# Patient Record
Sex: Female | Born: 1980 | Race: Black or African American | Hispanic: No | Smoking: Former smoker
Health system: Southern US, Community
[De-identification: ages and names within clinical notes are randomized; demographics above are authoritative.]

## PROBLEM LIST (undated history)

## (undated) DIAGNOSIS — F32A Depression, unspecified: Secondary | ICD-10-CM

## (undated) DIAGNOSIS — Z720 Tobacco use: Secondary | ICD-10-CM

## (undated) DIAGNOSIS — F329 Major depressive disorder, single episode, unspecified: Secondary | ICD-10-CM

## (undated) DIAGNOSIS — U071 COVID-19: Secondary | ICD-10-CM

## (undated) DIAGNOSIS — K219 Gastro-esophageal reflux disease without esophagitis: Secondary | ICD-10-CM

## (undated) DIAGNOSIS — G43909 Migraine, unspecified, not intractable, without status migrainosus: Secondary | ICD-10-CM

## (undated) DIAGNOSIS — I10 Essential (primary) hypertension: Secondary | ICD-10-CM

## (undated) HISTORY — PX: KNEE SURGERY: SHX244

## (undated) HISTORY — DX: Tobacco use: Z72.0

---

## 2001-09-23 ENCOUNTER — Emergency Department (HOSPITAL_COMMUNITY): Admission: EM | Admit: 2001-09-23 | Discharge: 2001-09-23 | Payer: Self-pay | Admitting: *Deleted

## 2002-01-31 ENCOUNTER — Emergency Department (HOSPITAL_COMMUNITY): Admission: EM | Admit: 2002-01-31 | Discharge: 2002-02-01 | Payer: Self-pay | Admitting: Emergency Medicine

## 2002-12-05 ENCOUNTER — Emergency Department (HOSPITAL_COMMUNITY): Admission: EM | Admit: 2002-12-05 | Discharge: 2002-12-05 | Payer: Self-pay | Admitting: *Deleted

## 2003-09-08 ENCOUNTER — Emergency Department (HOSPITAL_COMMUNITY): Admission: EM | Admit: 2003-09-08 | Discharge: 2003-09-09 | Payer: Self-pay | Admitting: *Deleted

## 2004-03-04 ENCOUNTER — Emergency Department (HOSPITAL_COMMUNITY): Admission: EM | Admit: 2004-03-04 | Discharge: 2004-03-04 | Payer: Self-pay | Admitting: Emergency Medicine

## 2005-10-08 ENCOUNTER — Emergency Department (HOSPITAL_COMMUNITY): Admission: EM | Admit: 2005-10-08 | Discharge: 2005-10-09 | Payer: Self-pay | Admitting: Emergency Medicine

## 2006-03-10 ENCOUNTER — Emergency Department (HOSPITAL_COMMUNITY): Admission: EM | Admit: 2006-03-10 | Discharge: 2006-03-10 | Payer: Self-pay | Admitting: Emergency Medicine

## 2006-03-12 ENCOUNTER — Emergency Department (HOSPITAL_COMMUNITY): Admission: EM | Admit: 2006-03-12 | Discharge: 2006-03-12 | Payer: Self-pay | Admitting: Emergency Medicine

## 2006-07-24 ENCOUNTER — Emergency Department (HOSPITAL_COMMUNITY): Admission: EM | Admit: 2006-07-24 | Discharge: 2006-07-24 | Payer: Self-pay | Admitting: Emergency Medicine

## 2006-10-25 ENCOUNTER — Emergency Department (HOSPITAL_COMMUNITY): Admission: EM | Admit: 2006-10-25 | Discharge: 2006-10-25 | Payer: Self-pay | Admitting: Emergency Medicine

## 2007-01-30 ENCOUNTER — Emergency Department (HOSPITAL_COMMUNITY): Admission: EM | Admit: 2007-01-30 | Discharge: 2007-01-30 | Payer: Self-pay | Admitting: Emergency Medicine

## 2007-11-23 ENCOUNTER — Emergency Department (HOSPITAL_COMMUNITY): Admission: EM | Admit: 2007-11-23 | Discharge: 2007-11-23 | Payer: Self-pay | Admitting: Emergency Medicine

## 2009-04-11 ENCOUNTER — Emergency Department (HOSPITAL_COMMUNITY): Admission: EM | Admit: 2009-04-11 | Discharge: 2009-04-12 | Payer: Self-pay | Admitting: Emergency Medicine

## 2010-01-31 ENCOUNTER — Emergency Department (HOSPITAL_COMMUNITY): Admission: EM | Admit: 2010-01-31 | Discharge: 2010-01-31 | Payer: Self-pay | Admitting: Emergency Medicine

## 2010-05-01 ENCOUNTER — Emergency Department (HOSPITAL_COMMUNITY): Admission: EM | Admit: 2010-05-01 | Discharge: 2010-05-01 | Payer: Self-pay | Admitting: Emergency Medicine

## 2010-05-07 ENCOUNTER — Emergency Department (HOSPITAL_COMMUNITY): Admission: EM | Admit: 2010-05-07 | Discharge: 2010-05-08 | Payer: Self-pay | Admitting: Emergency Medicine

## 2010-05-19 ENCOUNTER — Emergency Department (HOSPITAL_COMMUNITY): Admission: EM | Admit: 2010-05-19 | Discharge: 2010-05-20 | Payer: Self-pay | Admitting: Emergency Medicine

## 2010-11-10 LAB — CBC
HCT: 39.2 % (ref 36.0–46.0)
Hemoglobin: 13.6 g/dL (ref 12.0–15.0)
Hemoglobin: 12.6 g/dL (ref 12.0–15.0)
MCH: 32.8 pg (ref 26.0–34.0)
MCHC: 34.7 g/dL (ref 30.0–36.0)
MCHC: 33.3 g/dL (ref 30.0–36.0)
Platelets: 285 10*3/uL (ref 150–400)
RBC: 3.94 MIL/uL (ref 3.87–5.11)

## 2010-11-10 LAB — DIFFERENTIAL
Basophils Relative: 0 % (ref 0–1)
Basophils Relative: 1 % (ref 0–1)
Eosinophils Absolute: 0 10*3/uL (ref 0.0–0.7)
Eosinophils Relative: 0 % (ref 0–5)
Lymphs Abs: 2.3 10*3/uL (ref 0.7–4.0)
Monocytes Absolute: 0.4 10*3/uL (ref 0.1–1.0)
Monocytes Relative: 6 % (ref 3–12)
Monocytes Relative: 9 % (ref 3–12)
Neutro Abs: 5.2 10*3/uL (ref 1.7–7.7)
Neutrophils Relative %: 80 % — ABNORMAL HIGH (ref 43–77)
Neutrophils Relative %: 62 % (ref 43–77)

## 2010-11-10 LAB — RH IMMUNE GLOBULIN WORKUP (NOT WOMEN'S HOSP)
ABO/RH(D): O NEG
Antibody Screen: NEGATIVE

## 2010-11-10 LAB — URINALYSIS, ROUTINE W REFLEX MICROSCOPIC
Bilirubin Urine: NEGATIVE
Bilirubin Urine: NEGATIVE
Glucose, UA: NEGATIVE mg/dL
Glucose, UA: NEGATIVE mg/dL
Glucose, UA: NEGATIVE mg/dL
Hgb urine dipstick: NEGATIVE
Hgb urine dipstick: NEGATIVE
Ketones, ur: 15 mg/dL — AB
Ketones, ur: NEGATIVE mg/dL
Nitrite: NEGATIVE
Protein, ur: 30 mg/dL — AB
Protein, ur: NEGATIVE mg/dL
Specific Gravity, Urine: 1.02 (ref 1.005–1.030)
Specific Gravity, Urine: <1.005 — ABNORMAL LOW (ref 1.005–1.030)
Urobilinogen, UA: 1 mg/dL (ref 0.0–1.0)
Urobilinogen, UA: 0.2 mg/dL (ref 0.0–1.0)
pH: 6.5 (ref 5.0–8.0)
pH: 6.5 (ref 5.0–8.0)
pH: 6.5 (ref 5.0–8.0)

## 2010-11-10 LAB — ABO/RH: ABO/RH(D): O NEG

## 2010-11-10 LAB — BASIC METABOLIC PANEL
CO2: 24 mEq/L (ref 19–32)
Calcium: 9.4 mg/dL (ref 8.4–10.5)
Calcium: 9.3 mg/dL (ref 8.4–10.5)
Creatinine, Ser: 0.76 mg/dL (ref 0.4–1.2)
GFR calc Af Amer: >60 mL/min (ref >60)
GFR calc non Af Amer: >60 mL/min (ref >60)
Glucose, Bld: 114 mg/dL — ABNORMAL HIGH (ref 70–99)
Sodium: 137 mEq/L (ref 135–145)
Sodium: 133 mEq/L — ABNORMAL LOW (ref 135–145)

## 2010-11-10 LAB — HCG, QUANTITATIVE, PREGNANCY: hCG, Beta Chain, Quant, S: 15335 m[IU]/mL — ABNORMAL HIGH (ref <5)

## 2010-11-10 LAB — COMPREHENSIVE METABOLIC PANEL
AST: 16 U/L (ref 0–37)
Albumin: 4.2 g/dL (ref 3.5–5.2)
Alkaline Phosphatase: 35 U/L — ABNORMAL LOW (ref 39–117)
Chloride: 102 mEq/L (ref 96–112)
GFR calc Af Amer: >60 mL/min (ref >60)
Potassium: 3.2 mEq/L — ABNORMAL LOW (ref 3.5–5.1)
Total Bilirubin: 0.8 mg/dL (ref 0.3–1.2)
Total Protein: 7.3 g/dL (ref 6.0–8.3)

## 2010-11-10 LAB — URINE MICROSCOPIC-ADD ON

## 2010-11-10 LAB — RPR: RPR Ser Ql: NONREACTIVE

## 2010-11-10 LAB — GC/CHLAMYDIA PROBE AMP, GENITAL
Chlamydia, DNA Probe: NEGATIVE
GC Probe Amp, Genital: NEGATIVE

## 2010-11-14 LAB — CBC
HCT: 36.4 % (ref 36.0–46.0)
Hemoglobin: 12.3 g/dL (ref 12.0–15.0)
MCV: 94.1 fL (ref 78.0–100.0)
Platelets: 268 10*3/uL (ref 150–400)
WBC: 6.3 10*3/uL (ref 4.0–10.5)

## 2010-11-14 LAB — COMPREHENSIVE METABOLIC PANEL
Albumin: 3.9 g/dL (ref 3.5–5.2)
Alkaline Phosphatase: 49 U/L (ref 39–117)
BUN: 9 mg/dL (ref 6–23)
Chloride: 108 mEq/L (ref 96–112)
Creatinine, Ser: 0.77 mg/dL (ref 0.4–1.2)
GFR calc non Af Amer: >60 mL/min (ref >60)
Glucose, Bld: 86 mg/dL (ref 70–99)
Potassium: 3.3 mEq/L — ABNORMAL LOW (ref 3.5–5.1)
Total Bilirubin: 0.6 mg/dL (ref 0.3–1.2)

## 2010-11-14 LAB — POCT PREGNANCY, URINE: Preg Test, Ur: NEGATIVE

## 2010-11-14 LAB — DIFFERENTIAL
Basophils Absolute: 0.1 10*3/uL (ref 0.0–0.1)
Basophils Relative: 2 % — ABNORMAL HIGH (ref 0–1)
Lymphocytes Relative: 31 % (ref 12–46)
Monocytes Absolute: 0.6 10*3/uL (ref 0.1–1.0)
Neutro Abs: 3.5 10*3/uL (ref 1.7–7.7)
Neutrophils Relative %: 57 % (ref 43–77)

## 2010-11-14 LAB — URINALYSIS, ROUTINE W REFLEX MICROSCOPIC
Glucose, UA: NEGATIVE mg/dL
Hgb urine dipstick: NEGATIVE
Ketones, ur: NEGATIVE mg/dL
Protein, ur: NEGATIVE mg/dL
pH: 7.5 (ref 5.0–8.0)

## 2010-11-14 LAB — LIPASE, BLOOD: Lipase: 18 U/L (ref 11–59)

## 2010-12-09 ENCOUNTER — Emergency Department (HOSPITAL_COMMUNITY)
Admission: EM | Admit: 2010-12-09 | Discharge: 2010-12-09 | Disposition: A | Discharge status: Home / Self Care | Payer: Medicaid Other | Attending: Emergency Medicine | Admitting: Emergency Medicine

## 2010-12-09 DIAGNOSIS — R5381 Other malaise: Secondary | ICD-10-CM | POA: Insufficient documentation

## 2010-12-09 DIAGNOSIS — R05 Cough: Secondary | ICD-10-CM | POA: Insufficient documentation

## 2010-12-09 DIAGNOSIS — K219 Gastro-esophageal reflux disease without esophagitis: Secondary | ICD-10-CM | POA: Insufficient documentation

## 2010-12-09 DIAGNOSIS — J3489 Other specified disorders of nose and nasal sinuses: Secondary | ICD-10-CM | POA: Insufficient documentation

## 2010-12-09 DIAGNOSIS — R509 Fever, unspecified: Secondary | ICD-10-CM | POA: Insufficient documentation

## 2010-12-09 DIAGNOSIS — R059 Cough, unspecified: Secondary | ICD-10-CM | POA: Insufficient documentation

## 2010-12-09 DIAGNOSIS — M549 Dorsalgia, unspecified: Secondary | ICD-10-CM | POA: Insufficient documentation

## 2010-12-09 DIAGNOSIS — R112 Nausea with vomiting, unspecified: Secondary | ICD-10-CM | POA: Insufficient documentation

## 2010-12-09 DIAGNOSIS — IMO0001 Reserved for inherently not codable concepts without codable children: Secondary | ICD-10-CM | POA: Insufficient documentation

## 2010-12-09 DIAGNOSIS — A5901 Trichomonal vulvovaginitis: Secondary | ICD-10-CM | POA: Insufficient documentation

## 2010-12-09 DIAGNOSIS — N72 Inflammatory disease of cervix uteri: Secondary | ICD-10-CM | POA: Insufficient documentation

## 2010-12-09 LAB — COMPREHENSIVE METABOLIC PANEL
ALT: 11 U/L (ref 0–35)
AST: 16 U/L (ref 0–37)
CO2: 27 mEq/L (ref 19–32)
Chloride: 102 mEq/L (ref 96–112)
GFR calc Af Amer: >60 mL/min (ref >60)
GFR calc non Af Amer: >60 mL/min (ref >60)
Glucose, Bld: 96 mg/dL (ref 70–99)
Sodium: 136 mEq/L (ref 135–145)
Total Bilirubin: 0.6 mg/dL (ref 0.3–1.2)

## 2010-12-09 LAB — DIFFERENTIAL
Basophils Absolute: 0 10*3/uL (ref 0.0–0.1)
Basophils Relative: 0 % (ref 0–1)
Lymphocytes Relative: 10 % — ABNORMAL LOW (ref 12–46)
Monocytes Absolute: 1.1 10*3/uL — ABNORMAL HIGH (ref 0.1–1.0)
Monocytes Relative: 8 % (ref 3–12)
Neutro Abs: 12.5 10*3/uL — ABNORMAL HIGH (ref 1.7–7.7)
Neutrophils Relative %: 82 % — ABNORMAL HIGH (ref 43–77)

## 2010-12-09 LAB — CBC
HCT: 40.9 % (ref 36.0–46.0)
Hemoglobin: 13.9 g/dL (ref 12.0–15.0)
MCH: 32.3 pg (ref 26.0–34.0)
RBC: 4.31 MIL/uL (ref 3.87–5.11)

## 2010-12-09 LAB — URINALYSIS, ROUTINE W REFLEX MICROSCOPIC
Ketones, ur: 15 mg/dL — AB
Nitrite: NEGATIVE
Protein, ur: 30 mg/dL — AB
Urobilinogen, UA: 1 mg/dL (ref 0.0–1.0)
pH: 6.5 (ref 5.0–8.0)

## 2010-12-09 LAB — URINE MICROSCOPIC-ADD ON

## 2010-12-09 LAB — POCT PREGNANCY, URINE: Preg Test, Ur: NEGATIVE

## 2010-12-09 LAB — LIPASE, BLOOD: Lipase: 15 U/L (ref 11–59)

## 2010-12-10 LAB — URINE CULTURE
Colony Count: >=100000
Culture  Setup Time: 201204132138

## 2010-12-10 LAB — GC/CHLAMYDIA PROBE AMP, GENITAL: Chlamydia, DNA Probe: NEGATIVE

## 2011-06-15 LAB — CBC
Hemoglobin: 12
MCHC: 35.3
Platelets: 269
RDW: 13.5

## 2011-06-15 LAB — BASIC METABOLIC PANEL
BUN: 15
CO2: 25
Calcium: 8.9
Creatinine, Ser: 1.05
Glucose, Bld: 100 — ABNORMAL HIGH

## 2011-06-15 LAB — URINALYSIS, ROUTINE W REFLEX MICROSCOPIC
Bilirubin Urine: NEGATIVE
Protein, ur: NEGATIVE
Urobilinogen, UA: 1

## 2011-06-15 LAB — DIFFERENTIAL
Basophils Absolute: 0.1
Basophils Relative: 1
Monocytes Absolute: 0.4
Neutro Abs: 4.2
Neutrophils Relative %: 59

## 2011-06-15 LAB — PREGNANCY, URINE: Preg Test, Ur: NEGATIVE

## 2011-06-15 LAB — URINE MICROSCOPIC-ADD ON

## 2011-06-30 ENCOUNTER — Other Ambulatory Visit (HOSPITAL_COMMUNITY): Payer: Self-pay | Admitting: Internal Medicine

## 2011-07-03 ENCOUNTER — Other Ambulatory Visit (HOSPITAL_COMMUNITY): Payer: Medicaid Other

## 2011-07-04 ENCOUNTER — Ambulatory Visit (HOSPITAL_COMMUNITY)
Admission: RE | Admit: 2011-07-04 | Discharge: 2011-07-04 | Disposition: A | Discharge status: Home / Self Care | Payer: Medicaid Other | Source: Ambulatory Visit | Attending: Internal Medicine | Admitting: Internal Medicine

## 2011-07-04 DIAGNOSIS — R109 Unspecified abdominal pain: Secondary | ICD-10-CM | POA: Insufficient documentation

## 2011-07-04 DIAGNOSIS — R112 Nausea with vomiting, unspecified: Secondary | ICD-10-CM | POA: Insufficient documentation

## 2011-07-13 ENCOUNTER — Encounter (HOSPITAL_COMMUNITY): Payer: Self-pay | Admitting: Pharmacy Technician

## 2011-07-13 ENCOUNTER — Encounter (HOSPITAL_COMMUNITY)
Admission: RE | Admit: 2011-07-13 | Discharge: 2011-07-13 | Disposition: A | Discharge status: Home / Self Care | Payer: Medicaid Other | Source: Ambulatory Visit | Attending: General Surgery | Admitting: General Surgery

## 2011-07-13 ENCOUNTER — Other Ambulatory Visit: Payer: Self-pay

## 2011-07-13 ENCOUNTER — Encounter (HOSPITAL_COMMUNITY): Payer: Self-pay

## 2011-07-13 HISTORY — DX: Depression, unspecified: F32.A

## 2011-07-13 HISTORY — DX: Major depressive disorder, single episode, unspecified: F32.9

## 2011-07-13 LAB — BASIC METABOLIC PANEL
CO2: 27 mEq/L (ref 19–32)
Calcium: 9.8 mg/dL (ref 8.4–10.5)
Creatinine, Ser: 0.87 mg/dL (ref 0.50–1.10)
GFR calc non Af Amer: 89 mL/min — ABNORMAL LOW (ref >90)
Glucose, Bld: 93 mg/dL (ref 70–99)
Sodium: 139 mEq/L (ref 135–145)

## 2011-07-13 LAB — DIFFERENTIAL
Basophils Absolute: 0.1 10*3/uL (ref 0.0–0.1)
Basophils Relative: 1 % (ref 0–1)
Eosinophils Absolute: 0.1 10*3/uL (ref 0.0–0.7)
Eosinophils Relative: 1 % (ref 0–5)
Lymphs Abs: 1.9 10*3/uL (ref 0.7–4.0)
Neutrophils Relative %: 53 % (ref 43–77)

## 2011-07-13 LAB — CBC
MCH: 32 pg (ref 26.0–34.0)
MCHC: 34.3 g/dL (ref 30.0–36.0)
MCV: 93.3 fL (ref 78.0–100.0)
Platelets: 285 10*3/uL (ref 150–400)
RBC: 3.72 MIL/uL — ABNORMAL LOW (ref 3.87–5.11)
RDW: 13 % (ref 11.5–15.5)

## 2011-07-13 LAB — HEPATIC FUNCTION PANEL
ALT: 9 U/L (ref 0–35)
Albumin: 4 g/dL (ref 3.5–5.2)
Alkaline Phosphatase: 44 U/L (ref 39–117)
Total Bilirubin: 0.6 mg/dL (ref 0.3–1.2)
Total Protein: 7.4 g/dL (ref 6.0–8.3)

## 2011-07-13 LAB — SURGICAL PCR SCREEN: MRSA, PCR: NEGATIVE

## 2011-07-13 NOTE — H&P (Signed)
  NTS SOAP Note  Vital Signs:  Vitals as of: 07/13/2011: Systolic 178: Diastolic 112: Heart Rate 67: Temp 45F: Height 90ft 2in: Weight 144Lbs 0 Ounces: Pain Level 5: BMI 26  BMI : 26.34 kg/m2  Subjective: This 30 Years 9 Months old Female presents for of ABDOMINAL PAIN : ,Has been having intermittent right upper quadrant abdominal pain, nausea, and vomiting over the past few months. Fatty foods make it worse. No fever, chills, jaundice. U/S of gallbladder shows cholelithiasis with normal common bile duct.  Review of Symptoms:  Constitutional: unremarkable  Head: unremarkable  Eyes:unremarkable  Nose/Mouth/Throat:unremarkable  Cardiovascular:unremarkable  Respiratory: unremarkable  Gastrointestin abdominal pain,nausea,vomiting Genitourinary: unremarkable  Musculoskeletal: unremarkable  Skin: unremarkable  Hematolgic/Lymphatic: unremarkable  Allergic/Immunologic:unremarkable    Past Medical History:Reviewed  Past Medical History  Surgical History: none Medical Problems: none Allergies: nkda Medications: none   Social History: Reviewed   Social History  Preferred Language: English ([place]) Ethnicity: Not Hispanic / Latino Age: 30 Years 4 Months Alcohol: Yes, socially Recreational drug(s): No   Smoking Status: Current some day smoker reviewed on 07/13/2011 Started Date: 08/28/1998 Packs per week: 2.00   Family History: Reviewed  Family History  Is there a family history [id number]    Objective Information:  General: Well appearing, well nourished in no distress.  Head: Atraumatic; no masses; no abnormalities  No scleral icterus Heart: RRR, no murmur or gallop. Normal S1, S2. No S3, S4.  Lungs:CTA bilaterally, no wheezes, rhonchi, rales. Breathing unlabored.  Abdomen: Soft, tender in right upper quadrant to palpation, ND, normal bowel sounds, no HSM, no masses. No peritoneal signs.   Assessment: Biliary colic, cholelithiasis  Diagnosis &  Procedure: DiagnosisCode: 574.20, ProcedureCode: 19147,    Plan:Scheduled for laparoscopic cholecystectomy on 07/17/11.   Patient Education: Alternative treatments to surgery were discussed with patient (and family).Risks and benefits of procedure were fully explained to the patient (and family) who gave informed consent. Patient/family questions were addressed.  Follow-up: Pending Surgery

## 2011-07-13 NOTE — Pre-Procedure Instructions (Signed)
Bp 195/110,called to Dr Lovell Sheehan.He called Rx in to [facility]. Pt instructed to take as directed.She verbalized understanding of this.

## 2011-07-13 NOTE — Patient Instructions (Addendum)
21 Carol Edwards  07/13/2011   Your procedure is scheduled on:   07/17/2011 Report to [facility] at  1100  AM.  Call this number if you have problems the morning of surgery: [phone]   Remember:   Do not eat food:After Midnight.  Do not drink clear liquids: After Midnight.  Take these medicines the morning of surgery with A SIP OF WATER: reflux pill,blood pressure pill   Do not wear jewelry, make-up or nail polish.  Do not wear lotions, powders, or perfumes. You may wear deodorant.  Do not shave 48 hours prior to surgery.  Do not bring valuables to the hospital.  Contacts, dentures or bridgework may not be worn into surgery.  Leave suitcase in the car. After surgery it may be brought to your room.  For patients admitted to the hospital, checkout time is 11:00 AM the day of discharge.   Patients discharged the day of surgery will not be allowed to drive home.  Name and phone number of your driver: family  Special Instructions: CHG Shower Use Special Wash: 1/2 bottle night before surgery and 1/2 bottle morning of surgery.   Please read over the following fact sheets that you were given: Pain Booklet, MRSA Information, Surgical Site Infection Prevention, Anesthesia Post-op Instructions and Care and Recovery After Surgery Laparoscopic Cholecystectomy Laparoscopic cholecystectomy is surgery to remove the gallbladder. The gallbladder is located slightly to the right of center in the abdomen, behind the liver. It is a concentrating and storage sac for the bile produced in the liver. Bile aids in the digestion and absorption of fats. Gallbladder disease (cholecystitis) is an inflammation of your gallbladder. This condition is usually caused by a buildup of gallstones (cholelithiasis) in your gallbladder. Gallstones can block the flow of bile, resulting in inflammation and pain. In severe cases, emergency surgery may be required. When emergency surgery is not required, you will have time to prepare  for the procedure. Laparoscopic surgery is an alternative to open surgery. Laparoscopic surgery usually has a shorter recovery time. Your common bile duct may also need to be examined and explored. Your caregiver will discuss this with you if he or she feels this should be done. If stones are found in the common bile duct, they may be removed. LET YOUR CAREGIVER KNOW ABOUT:  Allergies to food or medicine.   Medicines taken, including vitamins, herbs, eyedrops, over-the-counter medicines, and creams.   Use of steroids (by mouth or creams).   Previous problems with anesthetics or numbing medicines.   History of bleeding problems or blood clots.   Previous surgery.   Other health problems, including diabetes and kidney problems.   Possibility of pregnancy, if this applies.  RISKS AND COMPLICATIONS All surgery is associated with risks. Some problems that may occur following this procedure include:  Infection.   Damage to the common bile duct, nerves, arteries, veins, or other internal organs such as the stomach or intestines.   Bleeding.   A stone may remain in the common bile duct.  BEFORE THE PROCEDURE  Do not take aspirin for 3 days prior to surgery or blood thinners for 1 week prior to surgery.   Do not eat or drink anything after midnight the night before surgery.   Let your caregiver know if you develop a cold or other infectious problem prior to surgery.   You should be present 60 minutes before the procedure or as directed.  PROCEDURE  You will be given medicine that makes  you sleep (general anesthetic). When you are asleep, your surgeon will make several small cuts (incisions) in your abdomen. One of these incisions is used to insert a small, lighted scope (laparoscope) into the abdomen. The laparoscope helps the surgeon see into your abdomen. Carbon dioxide gas will be pumped into your abdomen. The gas allows more room for the surgeon to perform your surgery. Other  operating instruments are inserted through the other incisions. Laparoscopic procedures may not be appropriate when:  There is major scarring from previous surgery.   The gallbladder is extremely inflamed.   There are bleeding disorders or unexpected cirrhosis of the liver.   A pregnancy is near term.   Other conditions make the laparoscopic procedure impossible.  If your surgeon feels it is not safe to continue with a laparoscopic procedure, he or she will perform an open abdominal procedure. In this case, the surgeon will make an incision to open the abdomen. This gives the surgeon a larger view and field to work within. This may allow the surgeon to perform procedures that sometimes cannot be performed with a laparoscope alone. Open surgery has a longer recovery time. AFTER THE PROCEDURE  You will be taken to the recovery area where a nurse will watch and check your progress.   You may be allowed to go home the same day.   Do not resume physical activities until directed by your caregiver.   You may resume a normal diet and activities as directed.  Document Released: 08/14/2005 Document Revised: 04/26/2011 Document Reviewed: 01/27/2011 [facility] Patient Information 2012 [place], [place].PATIENT INSTRUCTIONS POST-ANESTHESIA  IMMEDIATELY FOLLOWING SURGERY:  Do not drive or operate machinery for the first twenty four hours after surgery.  Do not make any important decisions for twenty four hours after surgery or while taking narcotic pain medications or sedatives.  If you develop intractable nausea and vomiting or a severe headache please notify your doctor immediately.  FOLLOW-UP:  Please make an appointment with your surgeon as instructed. You do not need to follow up with anesthesia unless specifically instructed to do so.  WOUND CARE INSTRUCTIONS (if applicable):  Keep a dry clean dressing on the anesthesia/puncture wound site if there is drainage.  Once the wound has quit draining  you may leave it open to air.  Generally you should leave the bandage intact for twenty four hours unless there is drainage.  If the epidural site drains for more than 36-48 hours please call the anesthesia department.  QUESTIONS?:  Please feel free to call your physician or the hospital operator if you have any questions, and they will be happy to assist you.     [facility] Anesthesia Department [address] [place] [phone]

## 2011-07-17 ENCOUNTER — Other Ambulatory Visit: Payer: Self-pay

## 2011-07-17 ENCOUNTER — Encounter (HOSPITAL_COMMUNITY)
Admission: RE | Disposition: A | Discharge status: Home / Self Care | Payer: Self-pay | Source: Ambulatory Visit | Attending: General Surgery

## 2011-07-17 ENCOUNTER — Ambulatory Visit (HOSPITAL_COMMUNITY)
Admission: RE | Admit: 2011-07-17 | Discharge: 2011-07-18 | Disposition: A | Discharge status: Home / Self Care | Payer: Medicaid Other | Source: Ambulatory Visit | Attending: General Surgery | Admitting: General Surgery

## 2011-07-17 ENCOUNTER — Encounter (HOSPITAL_COMMUNITY): Payer: Self-pay | Admitting: *Deleted

## 2011-07-17 ENCOUNTER — Encounter (HOSPITAL_COMMUNITY): Payer: Self-pay | Admitting: Anesthesiology

## 2011-07-17 ENCOUNTER — Other Ambulatory Visit: Payer: Self-pay | Admitting: General Surgery

## 2011-07-17 ENCOUNTER — Ambulatory Visit (HOSPITAL_COMMUNITY): Payer: Medicaid Other | Admitting: Anesthesiology

## 2011-07-17 DIAGNOSIS — K8010 Calculus of gallbladder with chronic cholecystitis without obstruction: Secondary | ICD-10-CM | POA: Insufficient documentation

## 2011-07-17 DIAGNOSIS — I10 Essential (primary) hypertension: Secondary | ICD-10-CM | POA: Insufficient documentation

## 2011-07-17 HISTORY — PX: CHOLECYSTECTOMY: SHX55

## 2011-07-17 HISTORY — DX: Gastro-esophageal reflux disease without esophagitis: K21.9

## 2011-07-17 SURGERY — LAPAROSCOPIC CHOLECYSTECTOMY
Anesthesia: General | Site: Abdomen | Wound class: Clean Contaminated

## 2011-07-17 MED ORDER — METOPROLOL TARTRATE 25 MG PO TABS
25.0000 mg | ORAL_TABLET | Freq: Two times a day (BID) | ORAL | Status: DC
  Administered 2011-07-17: 25 mg via ORAL
  Filled 2011-07-17 (×2): qty 1

## 2011-07-17 MED ORDER — BUPIVACAINE HCL 0.5 % IJ SOLN
INTRAMUSCULAR | Status: DC | PRN
  Administered 2011-07-17: 10 mL

## 2011-07-17 MED ORDER — GLYCOPYRROLATE 0.2 MG/ML IJ SOLN
INTRAMUSCULAR | Status: AC
  Filled 2011-07-17: qty 2

## 2011-07-17 MED ORDER — HYDROMORPHONE HCL PF 1 MG/ML IJ SOLN
1.0000 mg | INTRAMUSCULAR | Status: DC | PRN
  Administered 2011-07-17: 1 mg via INTRAVENOUS
  Filled 2011-07-17: qty 1

## 2011-07-17 MED ORDER — CEFAZOLIN SODIUM 1-5 GM-% IV SOLN: 1.0000 g | INTRAVENOUS | Status: DC

## 2011-07-17 MED ORDER — PANTOPRAZOLE SODIUM 40 MG PO TBEC
40.0000 mg | DELAYED_RELEASE_TABLET | Freq: Every day | ORAL | Status: DC
  Filled 2011-07-17: qty 1

## 2011-07-17 MED ORDER — MIDAZOLAM HCL 2 MG/2ML IJ SOLN
INTRAMUSCULAR | Status: AC
  Filled 2011-07-17: qty 2

## 2011-07-17 MED ORDER — FENTANYL CITRATE 0.05 MG/ML IJ SOLN
INTRAMUSCULAR | Status: DC | PRN
  Administered 2011-07-17 (×3): 50 ug via INTRAVENOUS
  Administered 2011-07-17: 150 ug via INTRAVENOUS

## 2011-07-17 MED ORDER — SODIUM CHLORIDE 0.9 % IR SOLN
Status: DC | PRN
  Administered 2011-07-17: 1000 mL

## 2011-07-17 MED ORDER — BUPIVACAINE HCL (PF) 0.5 % IJ SOLN
INTRAMUSCULAR | Status: AC
  Filled 2011-07-17: qty 30

## 2011-07-17 MED ORDER — FENTANYL CITRATE 0.05 MG/ML IJ SOLN
25.0000 ug | INTRAMUSCULAR | Status: DC | PRN
  Administered 2011-07-17 (×2): 50 ug via INTRAVENOUS

## 2011-07-17 MED ORDER — CEFAZOLIN SODIUM 1-5 GM-% IV SOLN
INTRAVENOUS | Status: DC | PRN
  Administered 2011-07-17: 1 g via INTRAVENOUS

## 2011-07-17 MED ORDER — GLYCOPYRROLATE 0.2 MG/ML IJ SOLN
0.2000 mg | Freq: Once | INTRAMUSCULAR | Status: AC
  Administered 2011-07-17: 0.2 mg via INTRAVENOUS

## 2011-07-17 MED ORDER — ENOXAPARIN SODIUM 40 MG/0.4ML ~~LOC~~ SOLN
40.0000 mg | Freq: Once | SUBCUTANEOUS | Status: AC
  Administered 2011-07-17: 40 mg via SUBCUTANEOUS

## 2011-07-17 MED ORDER — LIDOCAINE HCL 1 % IJ SOLN
INTRAMUSCULAR | Status: DC | PRN
  Administered 2011-07-17: 40 mg via INTRADERMAL

## 2011-07-17 MED ORDER — ONDANSETRON HCL 4 MG/2ML IJ SOLN
4.0000 mg | Freq: Four times a day (QID) | INTRAMUSCULAR | Status: DC | PRN
  Administered 2011-07-17 (×2): 4 mg via INTRAVENOUS
  Filled 2011-07-17 (×2): qty 2

## 2011-07-17 MED ORDER — HEMOSTATIC AGENTS (NO CHARGE) OPTIME
TOPICAL | Status: DC | PRN
  Administered 2011-07-17: 1 via TOPICAL

## 2011-07-17 MED ORDER — CEFAZOLIN SODIUM 1-5 GM-% IV SOLN
INTRAVENOUS | Status: AC
  Filled 2011-07-17: qty 50

## 2011-07-17 MED ORDER — MIDAZOLAM HCL 2 MG/2ML IJ SOLN
1.0000 mg | INTRAMUSCULAR | Status: DC | PRN
  Administered 2011-07-17 (×2): 2 mg via INTRAVENOUS

## 2011-07-17 MED ORDER — PROPOFOL 10 MG/ML IV EMUL
INTRAVENOUS | Status: AC
  Filled 2011-07-17: qty 20

## 2011-07-17 MED ORDER — METOPROLOL TARTRATE 1 MG/ML IV SOLN
INTRAVENOUS | Status: AC
  Filled 2011-07-17: qty 10

## 2011-07-17 MED ORDER — LABETALOL HCL 5 MG/ML IV SOLN
10.0000 mg | Freq: Once | INTRAVENOUS | Status: AC
  Administered 2011-07-17: 10 mg via INTRAVENOUS

## 2011-07-17 MED ORDER — METOPROLOL TARTRATE 1 MG/ML IV SOLN
INTRAVENOUS | Status: DC | PRN
  Administered 2011-07-17: 2 mg via INTRAVENOUS
  Administered 2011-07-17: 3 mg via INTRAVENOUS
  Administered 2011-07-17 (×2): 1 mg via INTRAVENOUS

## 2011-07-17 MED ORDER — ONDANSETRON HCL 4 MG/2ML IJ SOLN
4.0000 mg | Freq: Once | INTRAMUSCULAR | Status: AC | PRN
  Administered 2011-07-17: 4 mg via INTRAVENOUS

## 2011-07-17 MED ORDER — FENTANYL CITRATE 0.05 MG/ML IJ SOLN
INTRAMUSCULAR | Status: AC
  Administered 2011-07-17: 50 ug via INTRAVENOUS
  Filled 2011-07-17: qty 2

## 2011-07-17 MED ORDER — ONDANSETRON HCL 4 MG/2ML IJ SOLN
4.0000 mg | Freq: Once | INTRAMUSCULAR | Status: AC
  Administered 2011-07-17: 4 mg via INTRAVENOUS

## 2011-07-17 MED ORDER — LABETALOL HCL 5 MG/ML IV SOLN
INTRAVENOUS | Status: AC
  Filled 2011-07-17: qty 4

## 2011-07-17 MED ORDER — FENTANYL CITRATE 0.05 MG/ML IJ SOLN
INTRAMUSCULAR | Status: AC
  Filled 2011-07-17: qty 5

## 2011-07-17 MED ORDER — ROCURONIUM BROMIDE 50 MG/5ML IV SOLN
INTRAVENOUS | Status: AC
  Filled 2011-07-17: qty 1

## 2011-07-17 MED ORDER — LACTATED RINGERS IV SOLN
INTRAVENOUS | Status: DC
  Administered 2011-07-17: 18:00:00 via INTRAVENOUS

## 2011-07-17 MED ORDER — ACETAMINOPHEN 10 MG/ML IV SOLN
INTRAVENOUS | Status: AC
  Filled 2011-07-17: qty 100

## 2011-07-17 MED ORDER — HYDRALAZINE HCL 20 MG/ML IJ SOLN
INTRAMUSCULAR | Status: DC | PRN
  Administered 2011-07-17 (×2): 5 mg via INTRAVENOUS

## 2011-07-17 MED ORDER — ONDANSETRON HCL 4 MG/2ML IJ SOLN
INTRAMUSCULAR | Status: AC
  Administered 2011-07-17: 4 mg via INTRAVENOUS
  Filled 2011-07-17: qty 2

## 2011-07-17 MED ORDER — ONDANSETRON HCL 4 MG PO TABS: 4.0000 mg | ORAL_TABLET | Freq: Four times a day (QID) | ORAL | Status: DC | PRN

## 2011-07-17 MED ORDER — NEOSTIGMINE METHYLSULFATE 1 MG/ML IJ SOLN
INTRAMUSCULAR | Status: DC | PRN
  Administered 2011-07-17: 4 mg via INTRAVENOUS

## 2011-07-17 MED ORDER — CLONIDINE HCL 0.1 MG PO TABS
0.1000 mg | ORAL_TABLET | Freq: Two times a day (BID) | ORAL | Status: DC
  Administered 2011-07-17 (×2): 0.1 mg via ORAL
  Filled 2011-07-17 (×4): qty 1

## 2011-07-17 MED ORDER — ACETAMINOPHEN 10 MG/ML IV SOLN
1000.0000 mg | Freq: Four times a day (QID) | INTRAVENOUS | Status: AC
  Administered 2011-07-17 – 2011-07-18 (×4): 1000 mg via INTRAVENOUS
  Filled 2011-07-17 (×3): qty 100

## 2011-07-17 MED ORDER — LACTATED RINGERS IV SOLN
INTRAVENOUS | Status: DC
  Administered 2011-07-17 (×2): via INTRAVENOUS

## 2011-07-17 MED ORDER — HYDRALAZINE HCL 20 MG/ML IJ SOLN
INTRAMUSCULAR | Status: AC
  Filled 2011-07-17: qty 1

## 2011-07-17 MED ORDER — ONDANSETRON HCL 4 MG/2ML IJ SOLN
INTRAMUSCULAR | Status: AC
  Administered 2011-07-17: 15:00:00
  Filled 2011-07-17: qty 2

## 2011-07-17 MED ORDER — LIDOCAINE HCL (PF) 1 % IJ SOLN
INTRAMUSCULAR | Status: AC
  Filled 2011-07-17: qty 5

## 2011-07-17 MED ORDER — LISINOPRIL 10 MG PO TABS
20.0000 mg | ORAL_TABLET | Freq: Every day | ORAL | Status: DC
  Filled 2011-07-17: qty 2

## 2011-07-17 MED ORDER — PROPOFOL 10 MG/ML IV EMUL
INTRAVENOUS | Status: DC | PRN
  Administered 2011-07-17: 150 mg via INTRAVENOUS

## 2011-07-17 MED ORDER — ENOXAPARIN SODIUM 40 MG/0.4ML ~~LOC~~ SOLN
SUBCUTANEOUS | Status: AC
  Administered 2011-07-17: 40 mg via SUBCUTANEOUS
  Filled 2011-07-17: qty 0.4

## 2011-07-17 MED ORDER — ENOXAPARIN SODIUM 40 MG/0.4ML ~~LOC~~ SOLN
40.0000 mg | SUBCUTANEOUS | Status: DC
  Administered 2011-07-17: 40 mg via SUBCUTANEOUS
  Filled 2011-07-17: qty 0.4

## 2011-07-17 MED ORDER — ROCURONIUM BROMIDE 100 MG/10ML IV SOLN
INTRAVENOUS | Status: DC | PRN
  Administered 2011-07-17: 30 mg via INTRAVENOUS

## 2011-07-17 MED ORDER — GLYCOPYRROLATE 0.2 MG/ML IJ SOLN
INTRAMUSCULAR | Status: AC
  Filled 2011-07-17: qty 1

## 2011-07-17 MED ORDER — GLYCOPYRROLATE 0.2 MG/ML IJ SOLN
INTRAMUSCULAR | Status: DC | PRN
  Administered 2011-07-17: .6 mg via INTRAVENOUS

## 2011-07-17 SURGICAL SUPPLY — 33 items
APPLIER CLIP LAPSCP 10X32 DD (CLIP) ×1 IMPLANT
APPLIER CLIP ROT 10 11.4 M/L (STAPLE)
APR CLP MED LRG 11.4X10 (STAPLE)
BAG HAMPER (MISCELLANEOUS) ×2 IMPLANT
BAG SPEC RTRVL LRG 6X4 10 (ENDOMECHANICALS) ×1
CLIP APPLIE ROT 10 11.4 M/L (STAPLE) ×1 IMPLANT
CLOTH BEACON ORANGE TIMEOUT ST (SAFETY) ×2 IMPLANT
COVER LIGHT HANDLE STERIS (MISCELLANEOUS) ×4 IMPLANT
DECANTER SPIKE VIAL GLASS SM (MISCELLANEOUS) ×2 IMPLANT
DURAPREP 26ML APPLICATOR (WOUND CARE) ×2 IMPLANT
ELECT REM PT RETURN 9FT ADLT (ELECTROSURGICAL) ×2
ELECTRODE REM PT RTRN 9FT ADLT (ELECTROSURGICAL) ×1 IMPLANT
FILTER SMOKE EVAC LAPAROSHD (FILTER) ×2 IMPLANT
FORMALIN 10 PREFIL 120ML (MISCELLANEOUS) ×2 IMPLANT
GLOVE BIO SURGEON STRL SZ7.5 (GLOVE) ×2 IMPLANT
GLOVE ECLIPSE 6.5 STRL STRAW (GLOVE) ×2 IMPLANT
GLOVE INDICATOR 6.0 STRL GRN (GLOVE) ×2 IMPLANT
GOWN STRL REIN XL XLG (GOWN DISPOSABLE) ×6 IMPLANT
HEMOSTAT SNOW SURGICEL 2X4 (HEMOSTASIS) ×2 IMPLANT
INST SET LAPROSCOPIC AP (KITS) ×2 IMPLANT
KIT ROOM TURNOVER APOR (KITS) ×2 IMPLANT
KIT TROCAR LAP CHOLE (TROCAR) ×2 IMPLANT
MANIFOLD NEPTUNE II (INSTRUMENTS) ×2 IMPLANT
NS IRRIG 1000ML POUR BTL (IV SOLUTION) ×2 IMPLANT
PACK LAP CHOLE LZT030E (CUSTOM PROCEDURE TRAY) ×2 IMPLANT
PAD ARMBOARD 7.5X6 YLW CONV (MISCELLANEOUS) ×2 IMPLANT
POUCH SPECIMEN RETRIEVAL 10MM (ENDOMECHANICALS) ×2 IMPLANT
SET BASIN LINEN APH (SET/KITS/TRAYS/PACK) ×2 IMPLANT
SPONGE GAUZE 2X2 8PLY STRL LF (GAUZE/BANDAGES/DRESSINGS) ×8 IMPLANT
STAPLER VISISTAT (STAPLE) ×2 IMPLANT
SUT VICRYL 0 UR6 27IN ABS (SUTURE) ×2 IMPLANT
WARMER LAPAROSCOPE (MISCELLANEOUS) ×2 IMPLANT
YANKAUER SUCT 12FT TUBE ARGYLE (SUCTIONS) ×2 IMPLANT

## 2011-07-17 NOTE — Interval H&P Note (Signed)
History and Physical Interval Note:   07/17/2011   11:20 AM   Carol Edwards  has presented today for surgery, with the diagnosis of Calculus of gallbladder with other cholecystitis, without mention of obstruction [574.10]  The various methods of treatment have been discussed with the patient and family. After consideration of risks, benefits and other options for treatment, the patient has consented to  Procedure(s): LAPAROSCOPIC CHOLECYSTECTOMY as a surgical intervention .  The patients' history has been reviewed, patient examined, no change in status, stable for surgery.  I have reviewed the patients' chart and labs.  Questions were answered to the patient's satisfaction.     Dalia Heading  MD

## 2011-07-17 NOTE — Anesthesia Postprocedure Evaluation (Addendum)
  Anesthesia Post-op Note  Patient: Carol Edwards  Procedure(s) Performed:  LAPAROSCOPIC CHOLECYSTECTOMY  Patient Location: PACU  Anesthesia Type: General  Level of Consciousness: awake, alert  and oriented  Airway and Oxygen Therapy: Patient Spontanous Breathing  Post-op Pain: mild  Post-op Assessment: Post-op Vital signs reviewed, Patient's Cardiovascular Status Stable, Respiratory Function Stable and No signs of Nausea or vomiting  Post-op Vital Signs: Reviewed  Complications: No apparent anesthesia complications  07/18/11 1017- VSS and reviewed.  Discharged this am with no apparent anesthesia complications

## 2011-07-17 NOTE — Anesthesia Procedure Notes (Addendum)
Procedure Name: Intubation Date/Time: 07/17/2011 12:35 PM Performed by: Glynn Octave Pre-anesthesia Checklist: Patient identified, Patient being monitored, Timeout performed, Emergency Drugs available and Suction available Patient Re-evaluated:Patient Re-evaluated prior to inductionOxygen Delivery Method: Circle System Utilized Preoxygenation: Pre-oxygenation with 100% oxygen Intubation Type: IV induction Ventilation: Mask ventilation without difficulty Laryngoscope Size: Mac and 3 Grade View: Grade II Tube type: Oral Tube size: 7.0 mm Number of attempts: 1 Airway Equipment and Method: stylet Placement Confirmation: ETT inserted through vocal cords under direct vision,  positive ETCO2 and breath sounds checked- equal and bilateral Secured at: 21 cm Tube secured with: Tape Dental Injury: Teeth and Oropharynx as per pre-operative assessment  Comments: Rubber teeth guard used

## 2011-07-17 NOTE — Anesthesia Preprocedure Evaluation (Addendum)
Anesthesia Evaluation  Patient identified by MRN, date of birth, ID band Patient awake    Reviewed: Allergy & Precautions, H&P , NPO status , Patient's Chart, lab work & pertinent test results  Airway Mallampati: I      Dental  (+) Teeth Intact,    Pulmonary Current Smoker (am cough),    Pulmonary exam normal       Cardiovascular hypertension, Pt. on medications Regular Normal    Neuro/Psych PSYCHIATRIC DISORDERS Anxiety Depression    GI/Hepatic GERD-  Medicated,  Endo/Other    Renal/GU      Musculoskeletal   Abdominal   Peds  Hematology   Anesthesia Other Findings   Reproductive/Obstetrics                           Anesthesia Physical Anesthesia Plan  ASA: II  Anesthesia Plan: General   Post-op Pain Management:    Induction: Intravenous, Rapid sequence and Cricoid pressure planned  Airway Management Planned: Oral ETT  Additional Equipment:   Intra-op Plan:   Post-operative Plan: Extubation in OR  Informed Consent: I have reviewed the patients History and Physical, chart, labs and discussed the procedure including the risks, benefits and alternatives for the proposed anesthesia with the patient or authorized representative who has indicated his/her understanding and acceptance.     Plan Discussed with:   Anesthesia Plan Comments:         Anesthesia Quick Evaluation

## 2011-07-17 NOTE — Progress Notes (Signed)
NAMEDORRIS, Carol Edwards                ACCOUNT NO.:  [id number]  MEDICAL RECORD NO.:  [id number]  LOCATION:  A304                          FACILITY:  [facility]  PHYSICIAN:  Taeden Geller D. Felecia Shelling, MD   DATE OF BIRTH:  06/19/1981  DATE OF PROCEDURE:  07/17/2011 DATE OF DISCHARGE:  07/17/2011                                PROGRESS NOTE   This is a 30 year old female patient, who was admitted for observation after cholecystectomy.  The patient was found to have severe hypertension in the range of systolic to around 200 during the surgery and postsurgery.  The patient has no history of hypertension in the past, and during the preoperative evaluation, she was also found to have a blood pressure above 160 systolic.  The patient was started on a combination of clonidine, lisinopril, and labetalol.  Her blood pressure is gradually improving now.  The patient has no headache.  She is comfortably lying.  PHYSICAL EXAMINATION:  VITAL SIGNS:  Blood pressure 124/70, pulse 64, temperature 98 degrees Fahrenheit. HEENT:  Pupils are equal, reactive. NECK:  Supple. CHEST:  Clear lung fields.  Good air entry. CARDIOVASCULAR SYSTEM:  First and second heart sounds heard.  No murmur. No gallop. ABDOMEN:  Soft and lax.  Bowel sound is positive.  Incision sites are clean and dressed. EXTREMITIES:  No leg edema.  ASSESSMENT: 1. Severe hypertension. 2. Post cholecystectomy.  PLAN:  We will continue the patient on current combination of antihypertensive.  As her blood pressure is being controlled and the patient settled down, we will gradually take her off some of the blood pressure medicine.  Probably, we will discharge her only on lisinopril and labetalol.  We will discontinue clonidine on discharge.  If additional medication is required, we may start her on calcium channel blocker.     Carol Edwards D. Felecia Shelling, MD     TDF/MEDQ  D:  07/17/2011  T:  07/17/2011  Job:  [id number]

## 2011-07-17 NOTE — Transfer of Care (Signed)
Immediate Anesthesia Transfer of Care Note  Patient: Carol Edwards  Procedure(s) Performed:  LAPAROSCOPIC CHOLECYSTECTOMY  Patient Location: PACU  Anesthesia Type: General  Level of Consciousness: awake and alert   Airway & Oxygen Therapy: Patient Spontanous Breathing and Patient connected to face mask oxygen  Post-op Assessment: Report given to PACU RN  Post vital signs: Reviewed  Complications: No apparent anesthesia complications

## 2011-07-17 NOTE — Op Note (Signed)
Patient:  Carol Edwards  DOB:  1981-02-11  MRN:  [id number]   Preop Diagnosis:  Cholecystitis, cholelithiasis  Postop Diagnosis:  Same  Procedure:  Laparoscopic cholecystectomy  Surgeon:  Franky Macho, M.D.  Anes:  General endotracheal  Indications:  Patient is a 30 year old black female presents with biliary colic secondary to cholelithiasis. The risks and benefits of the procedure putting bleeding, infection, hepatobiliary injury, the possibly of an open procedure were fully explained to the patient, gave informed consent.  Procedure note:  Patient is placed the supine position. After induction of general endotracheal anesthesia, the abdomen was prepped and draped using usual sterile technique with DuraPrep. Surgical site confirmation was performed.  A supraumbilical incision was made down to the fascia. A Veress needle was introduced into the abdominal cavity and confirmation of placement was done using the saline drop test. The abdomen was then insufflated to 16 mm mercury pressure. 11 mm trocar was introduced into the abdominal cavity under direct visualization the difficulty. The patient was placed in reverse Trendelenburg position and additional 11 mm trocar was placed the epigastric region 5 mm trochars were placed were upper quadrant right flank regions. Liver was inspected and noted within normal limits. The gallbladder was retracted superior laterally. Dissection was begun around the infundibulum of the gallbladder. The cystic duct was first identified. Junction to the infundibulum flow identified. Endoclips placed proximally distally on the cystic duct and cystic duct was divided. This likewise done cystic artery. The gallbladder was then freed away from the gallbladder fossa using Bovie electrocautery. The gallbladder delivered through the epigastric trocar site using an Endo Catch bag. The gallbladder fossa was inspected and no abnormal bleeding or bile leakage was noted. Surgicel is  placed the gallbladder fossa. All fluid and air were then evacuated from the abdominal cavity prior to removal of the trochars.  All wounds were gave normal saline. All wounds were injected with 0.5% Sensorcaine. The suprabuccal fashion as reapproximated using 0 Vicryl interrupted suture. All skin incisions were closed using staples. Betadine ointment after dressings were applied.  All tape and needle counts were correct at the end of the procedure. Patient was extubated in the operating room went back to recovery awake in stable condition.  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 Do to this new finding and the difficult to control blood pressure, patient be admitted to the hospital for further evaluation of her malignant hypertension.  EBL:  Minimal  Specimen:  Gallbladder

## 2011-07-18 MED ORDER — METOPROLOL TARTRATE 25 MG PO TABS: 25.0000 mg | ORAL_TABLET | Freq: Two times a day (BID) | ORAL | Status: DC

## 2011-07-18 MED ORDER — OXYCODONE-ACETAMINOPHEN 7.5-325 MG PO TABS: 1.0000 | ORAL_TABLET | ORAL | Status: DC | PRN

## 2011-07-18 NOTE — Progress Notes (Signed)
Prescriptions given states understanding of discharge instructions. ivf d/ced stie wnl.

## 2011-07-18 NOTE — Discharge Summary (Signed)
Physician Discharge Summary  Patient ID: Carol Edwards MRN: [id number] DOB/AGE: 1981-03-17 30 y.o.  Admit date: 07/17/2011 Discharge date: 07/18/2011  Admission Diagnoses: Malignant hypertension, status post laparoscopic cholecystectomy  Discharge Diagnoses: Same Active Problems:  * No active hospital problems. *    Discharged Condition: good  Hospital Course: Patient is a 30 year old black female who underwent a laparoscopic cholecystectomy on 07/17/2011 who during the surgery, had significant episodes of hypertension which was difficult to control. She was on lisinopril operatively do to newly diagnosed hypertension. She was admitted to the hospital for further management of her hypertension. She was started on a beta blocker as well as clonidine. Her hypertension did become more controlled. Dr. Felecia Shelling, her primary care physician, was notified of her admission.  She is being discharged home with her blood pressure better controlled and in good condition.  Consults: none  Significant Diagnostic Studies: None  Treatments: surgery: Laparoscopic cholecystectomy  Discharge Exam: Blood pressure 121/76, pulse 67, temperature 98.7 F (37.1 C), temperature source Oral, resp. rate 20, height 5\' 2"  (1.575 m), weight 65.3 kg (143 lb 15.4 oz), last menstrual period 07/10/2011, SpO2 100.00%. General appearance: alert, cooperative and no distress Resp: clear to auscultation bilaterally Cardio: regular rate and rhythm, S1, S2 normal, no murmur, click, rub or gallop GI: Soft, flat. Dressings dry and intact.  Disposition: Home  Current Discharge Medication List    START taking these medications   Details  metoprolol tartrate (LOPRESSOR) 25 MG tablet Take 1 tablet (25 mg total) by mouth 2 (two) times daily. Qty: 60 tablet, Refills: 1    oxyCODONE-acetaminophen (PERCOCET) 7.5-325 MG per tablet Take 1-2 tablets by mouth every 4 (four) hours as needed for pain. Qty: 40 tablet, Refills: 0      CONTINUE these medications which have NOT CHANGED   Details  lisinopril (PRINIVIL,ZESTRIL) 20 MG tablet Take 20 mg by mouth daily.      omeprazole (PRILOSEC) 20 MG capsule Take 20 mg by mouth daily.         Follow-up Information    Follow up with Jimmy Stipes A. Make an appointment on 07/25/2011.   Contact information:   [address] [place] [address] [phone]          Signed: Dalia Heading 07/18/2011, 7:31 AM

## 2011-07-18 NOTE — Progress Notes (Signed)
UR Chart Review Completed  

## 2011-07-18 NOTE — Addendum Note (Signed)
Addendum  created 07/18/11 1018 by Glynn Octave   Modules edited:Notes Section

## 2011-07-24 ENCOUNTER — Encounter (HOSPITAL_COMMUNITY): Payer: Self-pay | Admitting: General Surgery

## 2011-10-01 ENCOUNTER — Emergency Department (HOSPITAL_COMMUNITY): Payer: Medicaid Other

## 2011-10-01 ENCOUNTER — Encounter (HOSPITAL_COMMUNITY): Payer: Self-pay | Admitting: *Deleted

## 2011-10-01 ENCOUNTER — Emergency Department (HOSPITAL_COMMUNITY)
Admission: EM | Admit: 2011-10-01 | Discharge: 2011-10-01 | Disposition: A | Discharge status: Home / Self Care | Payer: Medicaid Other | Attending: Emergency Medicine | Admitting: Emergency Medicine

## 2011-10-01 ENCOUNTER — Other Ambulatory Visit: Payer: Self-pay

## 2011-10-01 DIAGNOSIS — A6000 Herpesviral infection of urogenital system, unspecified: Secondary | ICD-10-CM | POA: Insufficient documentation

## 2011-10-01 DIAGNOSIS — R0789 Other chest pain: Secondary | ICD-10-CM

## 2011-10-01 DIAGNOSIS — R071 Chest pain on breathing: Secondary | ICD-10-CM | POA: Insufficient documentation

## 2011-10-01 DIAGNOSIS — F17200 Nicotine dependence, unspecified, uncomplicated: Secondary | ICD-10-CM | POA: Insufficient documentation

## 2011-10-01 DIAGNOSIS — F3289 Other specified depressive episodes: Secondary | ICD-10-CM | POA: Insufficient documentation

## 2011-10-01 DIAGNOSIS — Z79899 Other long term (current) drug therapy: Secondary | ICD-10-CM | POA: Insufficient documentation

## 2011-10-01 DIAGNOSIS — F329 Major depressive disorder, single episode, unspecified: Secondary | ICD-10-CM | POA: Insufficient documentation

## 2011-10-01 DIAGNOSIS — R109 Unspecified abdominal pain: Secondary | ICD-10-CM | POA: Insufficient documentation

## 2011-10-01 DIAGNOSIS — K219 Gastro-esophageal reflux disease without esophagitis: Secondary | ICD-10-CM | POA: Insufficient documentation

## 2011-10-01 DIAGNOSIS — I10 Essential (primary) hypertension: Secondary | ICD-10-CM | POA: Insufficient documentation

## 2011-10-01 DIAGNOSIS — R0602 Shortness of breath: Secondary | ICD-10-CM | POA: Insufficient documentation

## 2011-10-01 HISTORY — DX: Essential (primary) hypertension: I10

## 2011-10-01 LAB — DIFFERENTIAL
Basophils Absolute: 0.1 10*3/uL (ref 0.0–0.1)
Eosinophils Relative: 2 % (ref 0–5)
Lymphocytes Relative: 43 % (ref 12–46)
Lymphs Abs: 1.9 10*3/uL (ref 0.7–4.0)
Neutro Abs: 2 10*3/uL (ref 1.7–7.7)

## 2011-10-01 LAB — CBC
MCV: 93.4 fL (ref 78.0–100.0)
Platelets: 287 10*3/uL (ref 150–400)
RBC: 3.81 MIL/uL — ABNORMAL LOW (ref 3.87–5.11)
RDW: 13.2 % (ref 11.5–15.5)
WBC: 4.4 10*3/uL (ref 4.0–10.5)

## 2011-10-01 LAB — COMPREHENSIVE METABOLIC PANEL
ALT: 14 U/L (ref 0–35)
AST: 15 U/L (ref 0–37)
Alkaline Phosphatase: 46 U/L (ref 39–117)
CO2: 26 mEq/L (ref 19–32)
Chloride: 110 mEq/L (ref 96–112)
GFR calc Af Amer: >90 mL/min (ref >90)
GFR calc non Af Amer: 86 mL/min — ABNORMAL LOW (ref >90)
Glucose, Bld: 92 mg/dL (ref 70–99)
Potassium: 4 mEq/L (ref 3.5–5.1)
Sodium: 141 mEq/L (ref 135–145)
Total Bilirubin: 0.3 mg/dL (ref 0.3–1.2)

## 2011-10-01 MED ORDER — OXYCODONE-ACETAMINOPHEN 5-325 MG PO TABS
1.0000 | ORAL_TABLET | Freq: Once | ORAL | Status: AC
  Administered 2011-10-01: 1 via ORAL
  Filled 2011-10-01: qty 1

## 2011-10-01 MED ORDER — HYDROCODONE-ACETAMINOPHEN 5-325 MG PO TABS: 1.0000 | ORAL_TABLET | Freq: Four times a day (QID) | ORAL | Status: AC | PRN

## 2011-10-01 NOTE — ED Notes (Signed)
Pt states that she woke up at 2 am with chest pain and shortness of breath. States that the pain is radiating down her right arm. Also states that her blood pressure has been up.
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
## 2011-11-17 ENCOUNTER — Emergency Department (HOSPITAL_COMMUNITY)
Admission: EM | Admit: 2011-11-17 | Discharge: 2011-11-17 | Disposition: A | Discharge status: Home / Self Care | Payer: Medicaid Other | Attending: Emergency Medicine | Admitting: Emergency Medicine

## 2011-11-17 ENCOUNTER — Encounter (HOSPITAL_COMMUNITY): Payer: Self-pay | Admitting: Emergency Medicine

## 2011-11-17 ENCOUNTER — Other Ambulatory Visit: Payer: Self-pay

## 2011-11-17 ENCOUNTER — Emergency Department (HOSPITAL_COMMUNITY): Payer: Medicaid Other

## 2011-11-17 DIAGNOSIS — K219 Gastro-esophageal reflux disease without esophagitis: Secondary | ICD-10-CM | POA: Insufficient documentation

## 2011-11-17 DIAGNOSIS — R202 Paresthesia of skin: Secondary | ICD-10-CM

## 2011-11-17 DIAGNOSIS — M546 Pain in thoracic spine: Secondary | ICD-10-CM | POA: Insufficient documentation

## 2011-11-17 DIAGNOSIS — I10 Essential (primary) hypertension: Secondary | ICD-10-CM | POA: Insufficient documentation

## 2011-11-17 DIAGNOSIS — R5381 Other malaise: Secondary | ICD-10-CM | POA: Insufficient documentation

## 2011-11-17 DIAGNOSIS — R209 Unspecified disturbances of skin sensation: Secondary | ICD-10-CM | POA: Insufficient documentation

## 2011-11-17 DIAGNOSIS — F17200 Nicotine dependence, unspecified, uncomplicated: Secondary | ICD-10-CM | POA: Insufficient documentation

## 2011-11-17 MED ORDER — CLONIDINE HCL 0.2 MG PO TABS
ORAL_TABLET | ORAL | Status: AC
  Administered 2011-11-17: 0.2 mg via ORAL
  Filled 2011-11-17: qty 1
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
## 2011-11-17 NOTE — ED Notes (Signed)
Patient states she is comfortable at this time.

## 2011-11-17 NOTE — ED Notes (Signed)
D/c instructions reviewed with pt.  Pt verbalized understanding.

## 2011-11-17 NOTE — ED Notes (Signed)
Pt states had onset of right arm numbness and increased BP since yesterday.

## 2011-11-17 NOTE — ED Notes (Signed)
RN assessed sensation in RUE and LUE.  Sensation equal bilaterally.

## 2011-11-17 NOTE — ED Notes (Signed)
Patient states she is not having any pain at this time.

## 2011-11-17 NOTE — Discharge Instructions (Signed)
Take Tylenol if needed for pain. See your doctor or return here for problems.  Neuropathy Neuropathy means your peripheral nerves are not working normally. Peripheral nerves are the nerves outside the brain and spinal cord. Messages between the brain and the rest of the body do not work properly with peripheral nerve disorders. CAUSES There are many different causes of peripheral nerve disorders. These include:  Injury.   Infections.   Diabetes.   Vitamin deficiency.   Poor circulation.   Alcoholism.   Exposure to toxins.   Drug effects.   Tumors.   Kidney disease.  SYMPTOMS  Tingling, burning, pain, and numbness in the extremities.   Weakness and loss of muscle tone and size.  DIAGNOSIS Blood tests and special studies of nerve function may help confirm the diagnosis.  TREATMENT  Treatment includes adopting healthy life habits.   A good diet, vitamin supplements, and mild pain medicine may be needed.   Avoid known toxins such as alcohol, tobacco, and recreational drugs.   Anti-convulsant medicines are helpful in some types of neuropathy.  Make a follow-up appointment with your caregiver to be sure you are getting better with treatment.  SEEK IMMEDIATE MEDICAL CARE IF:   You have breathing problems.   You have severe or uncontrolled pain.   You notice extreme weakness or you feel faint.   You are not better after 1 week or if you have worse symptoms.  Document Released: 09/21/2004 Document Revised: 04/26/2011 Document Reviewed: 08/14/2005 [facility] Patient Information 2012 [place], [place].

## 2011-11-17 NOTE — Progress Notes (Addendum)
Date: 11/17/2011  1114  Rate: 64  Rhythm: normal sinus rhythm and with sinus arrhythmia  QRS Axis: normal  Intervals: normal  ST/T Wave abnormalities: normal  Conduction Disutrbances:none  Narrative Interpretation:   Old EKG Reviewed: changed c/w 09/29/2011, T wave inversion no longer present in anterior leads

## 2012-08-23 ENCOUNTER — Encounter (HOSPITAL_COMMUNITY): Payer: Self-pay | Admitting: *Deleted

## 2012-08-23 ENCOUNTER — Emergency Department (HOSPITAL_COMMUNITY)
Admission: EM | Admit: 2012-08-23 | Discharge: 2012-08-23 | Disposition: A | Discharge status: Home / Self Care | Payer: Medicaid Other | Attending: Emergency Medicine | Admitting: Emergency Medicine

## 2012-08-23 DIAGNOSIS — N390 Urinary tract infection, site not specified: Secondary | ICD-10-CM

## 2012-08-23 DIAGNOSIS — F17200 Nicotine dependence, unspecified, uncomplicated: Secondary | ICD-10-CM | POA: Insufficient documentation

## 2012-08-23 DIAGNOSIS — I10 Essential (primary) hypertension: Secondary | ICD-10-CM | POA: Insufficient documentation

## 2012-08-23 DIAGNOSIS — M545 Low back pain, unspecified: Secondary | ICD-10-CM | POA: Insufficient documentation

## 2012-08-23 DIAGNOSIS — Z8659 Personal history of other mental and behavioral disorders: Secondary | ICD-10-CM | POA: Insufficient documentation

## 2012-08-23 DIAGNOSIS — Z8619 Personal history of other infectious and parasitic diseases: Secondary | ICD-10-CM | POA: Insufficient documentation

## 2012-08-23 DIAGNOSIS — R319 Hematuria, unspecified: Secondary | ICD-10-CM | POA: Insufficient documentation

## 2012-08-23 DIAGNOSIS — R109 Unspecified abdominal pain: Secondary | ICD-10-CM | POA: Insufficient documentation

## 2012-08-23 DIAGNOSIS — Z8719 Personal history of other diseases of the digestive system: Secondary | ICD-10-CM | POA: Insufficient documentation

## 2012-08-23 DIAGNOSIS — Z79899 Other long term (current) drug therapy: Secondary | ICD-10-CM | POA: Insufficient documentation

## 2012-08-23 DIAGNOSIS — Z3202 Encounter for pregnancy test, result negative: Secondary | ICD-10-CM | POA: Insufficient documentation

## 2012-08-23 LAB — COMPREHENSIVE METABOLIC PANEL
AST: 17 U/L (ref 0–37)
Albumin: 4.1 g/dL (ref 3.5–5.2)
Alkaline Phosphatase: 53 U/L (ref 39–117)
BUN: 10 mg/dL (ref 6–23)
CO2: 27 mEq/L (ref 19–32)
Chloride: 105 mEq/L (ref 96–112)
GFR calc non Af Amer: >90 mL/min (ref >90)
Potassium: 3.8 mEq/L (ref 3.5–5.1)
Total Bilirubin: 0.4 mg/dL (ref 0.3–1.2)

## 2012-08-23 LAB — URINALYSIS, ROUTINE W REFLEX MICROSCOPIC
Glucose, UA: NEGATIVE mg/dL
Ketones, ur: NEGATIVE mg/dL
Protein, ur: 100 mg/dL — AB
Urobilinogen, UA: 0.2 mg/dL (ref 0.0–1.0)

## 2012-08-23 LAB — CBC WITH DIFFERENTIAL/PLATELET
Basophils Absolute: 0 10*3/uL (ref 0.0–0.1)
Basophils Relative: 1 % (ref 0–1)
HCT: 38.8 % (ref 36.0–46.0)
Hemoglobin: 12.9 g/dL (ref 12.0–15.0)
Lymphocytes Relative: 15 % (ref 12–46)
MCHC: 33.2 g/dL (ref 30.0–36.0)
Monocytes Absolute: 0.8 10*3/uL (ref 0.1–1.0)
Monocytes Relative: 10 % (ref 3–12)
Neutro Abs: 5.8 10*3/uL (ref 1.7–7.7)
Neutrophils Relative %: 73 % (ref 43–77)
RBC: 4.17 MIL/uL (ref 3.87–5.11)
WBC: 7.9 10*3/uL (ref 4.0–10.5)

## 2012-08-23 LAB — URINE MICROSCOPIC-ADD ON

## 2012-08-23 MED ORDER — KETOROLAC TROMETHAMINE 30 MG/ML IJ SOLN
30.0000 mg | Freq: Once | INTRAMUSCULAR | Status: AC
  Administered 2012-08-23: 30 mg via INTRAVENOUS
  Filled 2012-08-23: qty 1

## 2012-08-23 MED ORDER — CIPROFLOXACIN HCL 500 MG PO TABS: 500.0000 mg | ORAL_TABLET | Freq: Two times a day (BID) | ORAL | Status: DC

## 2012-08-23 MED ORDER — PHENAZOPYRIDINE HCL 200 MG PO TABS: 200.0000 mg | ORAL_TABLET | Freq: Three times a day (TID) | ORAL | Status: DC

## 2012-08-23 MED ORDER — SODIUM CHLORIDE 0.9 % IV BOLUS (SEPSIS)
1000.0000 mL | Freq: Once | INTRAVENOUS | Status: AC
  Administered 2012-08-23: 1000 mL via INTRAVENOUS

## 2012-08-23 MED ORDER — HYDROCODONE-ACETAMINOPHEN 5-325 MG PO TABS: 1.0000 | ORAL_TABLET | ORAL | Status: DC | PRN

## 2012-08-23 MED ORDER — ONDANSETRON HCL 4 MG/2ML IJ SOLN
4.0000 mg | Freq: Once | INTRAMUSCULAR | Status: AC
  Administered 2012-08-23: 4 mg via INTRAVENOUS
  Filled 2012-08-23: qty 2

## 2012-08-23 NOTE — ED Notes (Signed)
Pt states vomiting, lower abdominal pain, lower back pain and noticing blood when wiping after urination. Denies dysuria. NAD
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
## 2012-08-25 LAB — URINE CULTURE

## 2012-09-09 ENCOUNTER — Ambulatory Visit (HOSPITAL_COMMUNITY)
Admission: RE | Admit: 2012-09-09 | Discharge: 2012-09-09 | Disposition: A | Discharge status: Home / Self Care | Payer: Medicaid Other | Source: Ambulatory Visit | Attending: Internal Medicine | Admitting: Internal Medicine

## 2012-09-09 ENCOUNTER — Other Ambulatory Visit (HOSPITAL_COMMUNITY): Payer: Self-pay | Admitting: Internal Medicine

## 2012-09-09 DIAGNOSIS — R05 Cough: Secondary | ICD-10-CM

## 2012-09-09 DIAGNOSIS — R0989 Other specified symptoms and signs involving the circulatory and respiratory systems: Secondary | ICD-10-CM

## 2012-09-09 DIAGNOSIS — R059 Cough, unspecified: Secondary | ICD-10-CM | POA: Insufficient documentation

## 2012-09-09 DIAGNOSIS — J40 Bronchitis, not specified as acute or chronic: Secondary | ICD-10-CM

## 2012-10-04 ENCOUNTER — Other Ambulatory Visit (HOSPITAL_COMMUNITY): Payer: Self-pay | Admitting: Internal Medicine

## 2012-10-04 ENCOUNTER — Ambulatory Visit (HOSPITAL_COMMUNITY)
Admission: RE | Admit: 2012-10-04 | Discharge: 2012-10-04 | Disposition: A | Discharge status: Home / Self Care | Payer: Medicaid Other | Source: Ambulatory Visit | Attending: Internal Medicine | Admitting: Internal Medicine

## 2012-10-04 DIAGNOSIS — M549 Dorsalgia, unspecified: Secondary | ICD-10-CM

## 2012-10-04 DIAGNOSIS — M545 Low back pain, unspecified: Secondary | ICD-10-CM | POA: Insufficient documentation

## 2012-11-11 ENCOUNTER — Ambulatory Visit (HOSPITAL_COMMUNITY): Payer: Medicaid Other

## 2012-11-14 ENCOUNTER — Ambulatory Visit (HOSPITAL_COMMUNITY)
Admission: RE | Admit: 2012-11-14 | Discharge: 2012-11-14 | Disposition: A | Discharge status: Home / Self Care | Payer: Medicaid Other | Source: Ambulatory Visit | Attending: Internal Medicine | Admitting: Internal Medicine

## 2012-11-14 ENCOUNTER — Other Ambulatory Visit: Payer: Self-pay

## 2012-11-14 DIAGNOSIS — I517 Cardiomegaly: Secondary | ICD-10-CM

## 2012-11-14 DIAGNOSIS — I10 Essential (primary) hypertension: Secondary | ICD-10-CM | POA: Insufficient documentation

## 2012-11-14 NOTE — Progress Notes (Signed)
*  PRELIMINARY RESULTS* Echocardiogram 2D Echocardiogram has been performed.  [place] 11/14/2012, 3:28 PM

## 2012-12-19 ENCOUNTER — Encounter (HOSPITAL_COMMUNITY): Payer: Self-pay | Admitting: Emergency Medicine

## 2012-12-19 ENCOUNTER — Emergency Department (HOSPITAL_COMMUNITY)
Admission: EM | Admit: 2012-12-19 | Discharge: 2012-12-19 | Disposition: A | Discharge status: Home / Self Care | Payer: Medicaid Other | Attending: Emergency Medicine | Admitting: Emergency Medicine

## 2012-12-19 DIAGNOSIS — A499 Bacterial infection, unspecified: Secondary | ICD-10-CM | POA: Insufficient documentation

## 2012-12-19 DIAGNOSIS — N898 Other specified noninflammatory disorders of vagina: Secondary | ICD-10-CM | POA: Insufficient documentation

## 2012-12-19 DIAGNOSIS — Z3202 Encounter for pregnancy test, result negative: Secondary | ICD-10-CM | POA: Insufficient documentation

## 2012-12-19 DIAGNOSIS — Z79899 Other long term (current) drug therapy: Secondary | ICD-10-CM | POA: Insufficient documentation

## 2012-12-19 DIAGNOSIS — Z8619 Personal history of other infectious and parasitic diseases: Secondary | ICD-10-CM | POA: Insufficient documentation

## 2012-12-19 DIAGNOSIS — Z8659 Personal history of other mental and behavioral disorders: Secondary | ICD-10-CM | POA: Insufficient documentation

## 2012-12-19 DIAGNOSIS — N760 Acute vaginitis: Secondary | ICD-10-CM | POA: Insufficient documentation

## 2012-12-19 DIAGNOSIS — L089 Local infection of the skin and subcutaneous tissue, unspecified: Secondary | ICD-10-CM | POA: Insufficient documentation

## 2012-12-19 DIAGNOSIS — Z8719 Personal history of other diseases of the digestive system: Secondary | ICD-10-CM | POA: Insufficient documentation

## 2012-12-19 DIAGNOSIS — B9689 Other specified bacterial agents as the cause of diseases classified elsewhere: Secondary | ICD-10-CM | POA: Insufficient documentation

## 2012-12-19 DIAGNOSIS — I10 Essential (primary) hypertension: Secondary | ICD-10-CM | POA: Insufficient documentation

## 2012-12-19 DIAGNOSIS — F17200 Nicotine dependence, unspecified, uncomplicated: Secondary | ICD-10-CM | POA: Insufficient documentation

## 2012-12-19 LAB — URINALYSIS, ROUTINE W REFLEX MICROSCOPIC
Bilirubin Urine: NEGATIVE
Glucose, UA: NEGATIVE mg/dL
Hgb urine dipstick: NEGATIVE
Ketones, ur: NEGATIVE mg/dL
Leukocytes, UA: NEGATIVE
pH: 6.5 (ref 5.0–8.0)

## 2012-12-19 LAB — WET PREP, GENITAL
Trich, Wet Prep: NONE SEEN
Yeast Wet Prep HPF POC: NONE SEEN

## 2012-12-19 MED ORDER — DOXYCYCLINE HYCLATE 100 MG PO CAPS: 100.0000 mg | ORAL_CAPSULE | Freq: Two times a day (BID) | ORAL | Status: DC

## 2012-12-19 MED ORDER — METRONIDAZOLE 500 MG PO TABS: 500.0000 mg | ORAL_TABLET | Freq: Two times a day (BID) | ORAL | Status: DC

## 2012-12-19 MED ORDER — METRONIDAZOLE 500 MG PO TABS
500.0000 mg | ORAL_TABLET | Freq: Once | ORAL | Status: AC
  Administered 2012-12-19: 500 mg via ORAL
  Filled 2012-12-19: qty 1

## 2012-12-19 MED ORDER — DOXYCYCLINE HYCLATE 100 MG PO TABS
100.0000 mg | ORAL_TABLET | Freq: Once | ORAL | Status: AC
  Administered 2012-12-19: 100 mg via ORAL
  Filled 2012-12-19: qty 1

## 2012-12-19 NOTE — ED Notes (Signed)
Pt noticed spot to lateral Left leg. samll pea size area that looks like a blister noted with redness around. Slight swelling noted. Pt also c/o vaginal itching and burning x 3 days. Denies urinary sx's. Nad. C/o white vag d/c.

## 2012-12-20 LAB — RPR: RPR Ser Ql: NONREACTIVE

## 2012-12-21 LAB — GC/CHLAMYDIA PROBE AMP: GC Probe RNA: NEGATIVE
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
## 2012-12-24 NOTE — ED Provider Notes (Signed)
Medical screening examination/treatment/procedure(s) were performed by non-physician practitioner and as supervising physician I was immediately available for consultation/collaboration. Devoria Albe, MD, FACEP   Ward Givens, MD 12/24/12 [phone]

## 2013-04-22 ENCOUNTER — Emergency Department (HOSPITAL_COMMUNITY): Payer: Medicaid Other

## 2013-04-22 ENCOUNTER — Emergency Department (HOSPITAL_COMMUNITY)
Admission: EM | Admit: 2013-04-22 | Discharge: 2013-04-22 | Disposition: A | Discharge status: Home / Self Care | Payer: Medicaid Other | Attending: Emergency Medicine | Admitting: Emergency Medicine

## 2013-04-22 ENCOUNTER — Encounter (HOSPITAL_COMMUNITY): Payer: Self-pay | Admitting: *Deleted

## 2013-04-22 DIAGNOSIS — I10 Essential (primary) hypertension: Secondary | ICD-10-CM | POA: Insufficient documentation

## 2013-04-22 DIAGNOSIS — J209 Acute bronchitis, unspecified: Secondary | ICD-10-CM | POA: Insufficient documentation

## 2013-04-22 DIAGNOSIS — R509 Fever, unspecified: Secondary | ICD-10-CM | POA: Insufficient documentation

## 2013-04-22 DIAGNOSIS — R0602 Shortness of breath: Secondary | ICD-10-CM | POA: Insufficient documentation

## 2013-04-22 DIAGNOSIS — Z8659 Personal history of other mental and behavioral disorders: Secondary | ICD-10-CM | POA: Insufficient documentation

## 2013-04-22 DIAGNOSIS — R059 Cough, unspecified: Secondary | ICD-10-CM | POA: Insufficient documentation

## 2013-04-22 DIAGNOSIS — R071 Chest pain on breathing: Secondary | ICD-10-CM | POA: Insufficient documentation

## 2013-04-22 DIAGNOSIS — F17200 Nicotine dependence, unspecified, uncomplicated: Secondary | ICD-10-CM | POA: Insufficient documentation

## 2013-04-22 DIAGNOSIS — Z8719 Personal history of other diseases of the digestive system: Secondary | ICD-10-CM | POA: Insufficient documentation

## 2013-04-22 DIAGNOSIS — R61 Generalized hyperhidrosis: Secondary | ICD-10-CM | POA: Insufficient documentation

## 2013-04-22 DIAGNOSIS — R51 Headache: Secondary | ICD-10-CM | POA: Insufficient documentation

## 2013-04-22 DIAGNOSIS — Z8619 Personal history of other infectious and parasitic diseases: Secondary | ICD-10-CM | POA: Insufficient documentation

## 2013-04-22 DIAGNOSIS — R05 Cough: Secondary | ICD-10-CM | POA: Insufficient documentation

## 2013-04-22 DIAGNOSIS — R062 Wheezing: Secondary | ICD-10-CM | POA: Insufficient documentation

## 2013-04-22 DIAGNOSIS — J3489 Other specified disorders of nose and nasal sinuses: Secondary | ICD-10-CM | POA: Insufficient documentation

## 2013-04-22 DIAGNOSIS — R112 Nausea with vomiting, unspecified: Secondary | ICD-10-CM | POA: Insufficient documentation

## 2013-04-22 DIAGNOSIS — Z79899 Other long term (current) drug therapy: Secondary | ICD-10-CM | POA: Insufficient documentation

## 2013-04-22 DIAGNOSIS — R0789 Other chest pain: Secondary | ICD-10-CM

## 2013-04-22 LAB — POCT I-STAT, CHEM 8
Calcium, Ion: 1.17 mmol/L (ref 1.12–1.23)
HCT: 38 % (ref 36.0–46.0)
Hemoglobin: 12.9 g/dL (ref 12.0–15.0)
TCO2: 23 mmol/L (ref 0–100)

## 2013-04-22 MED ORDER — AMOXICILLIN 500 MG PO CAPS: 500.0000 mg | ORAL_CAPSULE | Freq: Three times a day (TID) | ORAL | Status: DC

## 2013-04-22 MED ORDER — DM-GUAIFENESIN ER 30-600 MG PO TB12
1.0000 | ORAL_TABLET | Freq: Two times a day (BID) | ORAL | Status: DC
  Administered 2013-04-22: 1 via ORAL
  Filled 2013-04-22: qty 1

## 2013-04-22 MED ORDER — METOCLOPRAMIDE HCL 5 MG/ML IJ SOLN
10.0000 mg | Freq: Once | INTRAMUSCULAR | Status: AC
  Administered 2013-04-22: 10 mg via INTRAVENOUS
  Filled 2013-04-22: qty 2

## 2013-04-22 MED ORDER — IPRATROPIUM BROMIDE 0.02 % IN SOLN
0.5000 mg | Freq: Once | RESPIRATORY_TRACT | Status: AC
  Administered 2013-04-22: 0.5 mg via RESPIRATORY_TRACT
  Filled 2013-04-22: qty 2.5

## 2013-04-22 MED ORDER — ALBUTEROL SULFATE (5 MG/ML) 0.5% IN NEBU
5.0000 mg | INHALATION_SOLUTION | Freq: Once | RESPIRATORY_TRACT | Status: AC
  Administered 2013-04-22: 5 mg via RESPIRATORY_TRACT
  Filled 2013-04-22: qty 1

## 2013-04-22 MED ORDER — ALBUTEROL SULFATE HFA 108 (90 BASE) MCG/ACT IN AERS: 2.0000 | INHALATION_SPRAY | Freq: Four times a day (QID) | RESPIRATORY_TRACT | Status: DC | PRN

## 2013-04-22 MED ORDER — CLONIDINE HCL 0.1 MG PO TABS
0.1000 mg | ORAL_TABLET | Freq: Once | ORAL | Status: AC
  Administered 2013-04-22: 0.1 mg via ORAL
  Filled 2013-04-22: qty 1

## 2013-04-22 MED ORDER — PREDNISONE 20 MG PO TABS: ORAL_TABLET | ORAL | Status: DC

## 2013-04-22 MED ORDER — DIPHENHYDRAMINE HCL 50 MG/ML IJ SOLN
25.0000 mg | Freq: Once | INTRAMUSCULAR | Status: AC
  Administered 2013-04-22: 25 mg via INTRAVENOUS
  Filled 2013-04-22: qty 1

## 2013-04-22 MED ORDER — KETOROLAC TROMETHAMINE 30 MG/ML IJ SOLN
30.0000 mg | Freq: Once | INTRAMUSCULAR | Status: AC
  Administered 2013-04-22: 30 mg via INTRAVENOUS
  Filled 2013-04-22: qty 1

## 2013-04-22 MED ORDER — AEROCHAMBER Z-STAT PLUS/MEDIUM MISC: Status: DC

## 2013-04-22 MED ORDER — AMLODIPINE BESYLATE 10 MG PO TABS: 10.0000 mg | ORAL_TABLET | Freq: Every day | ORAL | Status: DC

## 2013-04-22 MED ORDER — DM-GUAIFENESIN ER 30-600 MG PO TB12: 1.0000 | ORAL_TABLET | Freq: Two times a day (BID) | ORAL | Status: DC | PRN

## 2013-04-22 NOTE — ED Notes (Signed)
Encouraged to eat more potassium rich foods.

## 2013-04-22 NOTE — ED Notes (Signed)
Pt states that her blood pressure is normally high, a reading at a local bp fair was 180/114 on 04/19/2013.
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
## 2013-04-22 NOTE — ED Notes (Addendum)
Pt c/o cough, congestion, sob when she sleeps, chest pain,headache, n/v/d that started three days.cough is productive with yellow mucous production,

## 2013-07-05 ENCOUNTER — Encounter (HOSPITAL_COMMUNITY): Payer: Self-pay | Admitting: Emergency Medicine

## 2013-07-05 ENCOUNTER — Emergency Department (HOSPITAL_COMMUNITY)
Admission: EM | Admit: 2013-07-05 | Discharge: 2013-07-05 | Disposition: A | Discharge status: Home / Self Care | Payer: Medicaid Other | Attending: Emergency Medicine | Admitting: Emergency Medicine

## 2013-07-05 ENCOUNTER — Emergency Department (HOSPITAL_COMMUNITY): Payer: Medicaid Other

## 2013-07-05 DIAGNOSIS — Z79899 Other long term (current) drug therapy: Secondary | ICD-10-CM | POA: Insufficient documentation

## 2013-07-05 DIAGNOSIS — I10 Essential (primary) hypertension: Secondary | ICD-10-CM | POA: Insufficient documentation

## 2013-07-05 DIAGNOSIS — Z8659 Personal history of other mental and behavioral disorders: Secondary | ICD-10-CM | POA: Insufficient documentation

## 2013-07-05 DIAGNOSIS — J069 Acute upper respiratory infection, unspecified: Secondary | ICD-10-CM | POA: Insufficient documentation

## 2013-07-05 DIAGNOSIS — F17200 Nicotine dependence, unspecified, uncomplicated: Secondary | ICD-10-CM | POA: Insufficient documentation

## 2013-07-05 DIAGNOSIS — Z8619 Personal history of other infectious and parasitic diseases: Secondary | ICD-10-CM | POA: Insufficient documentation

## 2013-07-05 DIAGNOSIS — Z8719 Personal history of other diseases of the digestive system: Secondary | ICD-10-CM | POA: Insufficient documentation

## 2013-07-05 LAB — RAPID STREP SCREEN (MED CTR MEBANE ONLY): Streptococcus, Group A Screen (Direct): NEGATIVE

## 2013-07-05 MED ORDER — ALBUTEROL SULFATE HFA 108 (90 BASE) MCG/ACT IN AERS: 2.0000 | INHALATION_SPRAY | RESPIRATORY_TRACT | Status: DC | PRN

## 2013-07-05 MED ORDER — BENZONATATE 100 MG PO CAPS: 100.0000 mg | ORAL_CAPSULE | Freq: Three times a day (TID) | ORAL | Status: DC | PRN

## 2013-07-05 NOTE — ED Notes (Signed)
Headache, sore throat, bilateral ear pain and chest discomfort at night, described as tightness when trying to sleep. Cough, productive and green in color.
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
## 2013-07-08 LAB — CULTURE, GROUP A STREP

## 2014-02-10 ENCOUNTER — Emergency Department (HOSPITAL_COMMUNITY)
Admission: EM | Admit: 2014-02-10 | Discharge: 2014-02-10 | Disposition: A | Discharge status: Home / Self Care | Payer: Medicaid Other | Attending: Emergency Medicine | Admitting: Emergency Medicine

## 2014-02-10 ENCOUNTER — Encounter (HOSPITAL_COMMUNITY): Payer: Self-pay | Admitting: Emergency Medicine

## 2014-02-10 DIAGNOSIS — R059 Cough, unspecified: Secondary | ICD-10-CM | POA: Insufficient documentation

## 2014-02-10 DIAGNOSIS — I10 Essential (primary) hypertension: Secondary | ICD-10-CM | POA: Insufficient documentation

## 2014-02-10 DIAGNOSIS — J029 Acute pharyngitis, unspecified: Secondary | ICD-10-CM | POA: Insufficient documentation

## 2014-02-10 DIAGNOSIS — F17200 Nicotine dependence, unspecified, uncomplicated: Secondary | ICD-10-CM | POA: Insufficient documentation

## 2014-02-10 DIAGNOSIS — E860 Dehydration: Secondary | ICD-10-CM | POA: Insufficient documentation

## 2014-02-10 DIAGNOSIS — IMO0001 Reserved for inherently not codable concepts without codable children: Secondary | ICD-10-CM | POA: Insufficient documentation

## 2014-02-10 DIAGNOSIS — R05 Cough: Secondary | ICD-10-CM | POA: Insufficient documentation

## 2014-02-10 DIAGNOSIS — Z79899 Other long term (current) drug therapy: Secondary | ICD-10-CM | POA: Insufficient documentation

## 2014-02-10 DIAGNOSIS — Z8619 Personal history of other infectious and parasitic diseases: Secondary | ICD-10-CM | POA: Insufficient documentation

## 2014-02-10 LAB — RAPID STREP SCREEN (MED CTR MEBANE ONLY): STREPTOCOCCUS, GROUP A SCREEN (DIRECT): NEGATIVE

## 2014-02-10 MED ORDER — SODIUM CHLORIDE 0.9 % IV SOLN: 1000.0000 mL | INTRAVENOUS | Status: DC

## 2014-02-10 MED ORDER — PENICILLIN G BENZATHINE 1200000 UNIT/2ML IM SUSP
1.2000 10*6.[IU] | Freq: Once | INTRAMUSCULAR | Status: AC
  Administered 2014-02-10: 1.2 10*6.[IU] via INTRAMUSCULAR
  Filled 2014-02-10 (×2): qty 2

## 2014-02-10 MED ORDER — SODIUM CHLORIDE 0.9 % IV SOLN
1000.0000 mL | Freq: Once | INTRAVENOUS | Status: AC
  Administered 2014-02-10: 1000 mL via INTRAVENOUS

## 2014-02-10 MED ORDER — FENTANYL CITRATE 0.05 MG/ML IJ SOLN
50.0000 ug | Freq: Once | INTRAMUSCULAR | Status: AC
  Administered 2014-02-10: 50 ug via INTRAVENOUS
  Filled 2014-02-10: qty 2

## 2014-02-10 MED ORDER — DEXAMETHASONE SODIUM PHOSPHATE 4 MG/ML IJ SOLN
10.0000 mg | Freq: Once | INTRAMUSCULAR | Status: AC
  Administered 2014-02-10: 10 mg via INTRAVENOUS
  Filled 2014-02-10: qty 3

## 2014-02-10 MED ORDER — ACETAMINOPHEN 500 MG PO TABS
1000.0000 mg | ORAL_TABLET | Freq: Once | ORAL | Status: AC
  Administered 2014-02-10: 1000 mg via ORAL
  Filled 2014-02-10: qty 2

## 2014-02-10 MED ORDER — HYDROCODONE-ACETAMINOPHEN 7.5-325 MG/15ML PO SOLN: 15.0000 mL | Freq: Four times a day (QID) | ORAL | Status: DC | PRN
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
## 2014-02-10 NOTE — ED Notes (Signed)
Pt ambulated to the bathroom independently with no difficulty. Nad noted at this time.

## 2014-02-10 NOTE — Discharge Instructions (Signed)
Drink plenty of fluids. Take the hycet for severe pain on swallowing. You can also take childrens ibuprofen liquid 800 mg every 6 hrs for pain or fever. When the hycet runs out, you can take childrens acetaminophen 1000 mg every 6 hrs for pain or fever. You can try chloroseptic spray or cough drops for pain, also. Recheck if you are unable to swallow, you have trouble breathing or your pain gets worse on one side of your throat.     Pharyngitis Pharyngitis is redness, pain, and swelling (inflammation) of your pharynx.  CAUSES  Pharyngitis is usually caused by infection. Most of the time, these infections are from viruses (viral) and are part of a cold. However, sometimes pharyngitis is caused by bacteria (bacterial). Pharyngitis can also be caused by allergies. Viral pharyngitis may be spread from person to person by coughing, sneezing, and personal items or utensils (cups, forks, spoons, toothbrushes). Bacterial pharyngitis may be spread from person to person by more intimate contact, such as kissing.  SIGNS AND SYMPTOMS  Symptoms of pharyngitis include:   Sore throat.   Tiredness (fatigue).   Low-grade fever.   Headache.  Joint pain and muscle aches.  Skin rashes.  Swollen lymph nodes.  Plaque-like film on throat or tonsils (often seen with bacterial pharyngitis). DIAGNOSIS  Your health care provider will ask you questions about your illness and your symptoms. Your medical history, along with a physical exam, is often all that is needed to diagnose pharyngitis. Sometimes, a rapid strep test is done. Other lab tests may also be done, depending on the suspected cause.  TREATMENT  Viral pharyngitis will usually get better in 3 4 days without the use of medicine. Bacterial pharyngitis is treated with medicines that kill germs (antibiotics).  HOME CARE INSTRUCTIONS   Drink enough water and fluids to keep your urine clear or pale yellow.   Only take over-the-counter or prescription  medicines as directed by your health care provider:   If you are prescribed antibiotics, make sure you finish them even if you start to feel better.   Do not take aspirin.   Get lots of rest.   Gargle with 8 oz of salt water ( tsp of salt per 1 qt of water) as often as every 1 2 hours to soothe your throat.   Throat lozenges (if you are not at risk for choking) or sprays may be used to soothe your throat. SEEK MEDICAL CARE IF:   You have large, tender lumps in your neck.  You have a rash.  You cough up green, yellow-brown, or bloody spit. SEEK IMMEDIATE MEDICAL CARE IF:   Your neck becomes stiff.  You drool or are unable to swallow liquids.  You vomit or are unable to keep medicines or liquids down.  You have severe pain that does not go away with the use of recommended medicines.  You have trouble breathing (not caused by a stuffy nose). MAKE SURE YOU:   Understand these instructions.  Will watch your condition.  Will get help right away if you are not doing well or get worse. Document Released: 08/14/2005 Document Revised: 06/04/2013 Document Reviewed: 04/21/2013 [facility] Patient Information 2014 [place], [place].

## 2014-02-10 NOTE — ED Notes (Signed)
Body aches and sore throat since yesterday. Nad.

## 2014-02-12 LAB — CULTURE, GROUP A STREP

## 2014-07-02 ENCOUNTER — Emergency Department (HOSPITAL_COMMUNITY)
Admission: EM | Admit: 2014-07-02 | Discharge: 2014-07-02 | Disposition: A | Discharge status: Home / Self Care | Payer: Medicaid Other | Attending: Emergency Medicine | Admitting: Emergency Medicine

## 2014-07-02 ENCOUNTER — Encounter (HOSPITAL_COMMUNITY): Payer: Self-pay | Admitting: Emergency Medicine

## 2014-07-02 DIAGNOSIS — Z8659 Personal history of other mental and behavioral disorders: Secondary | ICD-10-CM | POA: Diagnosis not present

## 2014-07-02 DIAGNOSIS — Z8619 Personal history of other infectious and parasitic diseases: Secondary | ICD-10-CM | POA: Diagnosis not present

## 2014-07-02 DIAGNOSIS — N760 Acute vaginitis: Secondary | ICD-10-CM | POA: Diagnosis not present

## 2014-07-02 DIAGNOSIS — Z3202 Encounter for pregnancy test, result negative: Secondary | ICD-10-CM | POA: Diagnosis not present

## 2014-07-02 DIAGNOSIS — Z720 Tobacco use: Secondary | ICD-10-CM | POA: Insufficient documentation

## 2014-07-02 DIAGNOSIS — R112 Nausea with vomiting, unspecified: Secondary | ICD-10-CM

## 2014-07-02 DIAGNOSIS — Z8719 Personal history of other diseases of the digestive system: Secondary | ICD-10-CM | POA: Diagnosis not present

## 2014-07-02 DIAGNOSIS — Z79899 Other long term (current) drug therapy: Secondary | ICD-10-CM | POA: Insufficient documentation

## 2014-07-02 DIAGNOSIS — I10 Essential (primary) hypertension: Secondary | ICD-10-CM | POA: Diagnosis not present

## 2014-07-02 DIAGNOSIS — B9689 Other specified bacterial agents as the cause of diseases classified elsewhere: Secondary | ICD-10-CM

## 2014-07-02 DIAGNOSIS — R51 Headache: Secondary | ICD-10-CM | POA: Insufficient documentation

## 2014-07-02 DIAGNOSIS — R519 Headache, unspecified: Secondary | ICD-10-CM

## 2014-07-02 LAB — URINALYSIS, ROUTINE W REFLEX MICROSCOPIC
BILIRUBIN URINE: NEGATIVE
Glucose, UA: NEGATIVE mg/dL
Ketones, ur: NEGATIVE mg/dL
Leukocytes, UA: NEGATIVE
NITRITE: NEGATIVE
Protein, ur: NEGATIVE mg/dL
Specific Gravity, Urine: 1.015 (ref 1.005–1.030)
UROBILINOGEN UA: 0.2 mg/dL (ref 0.0–1.0)
pH: 8.5 — ABNORMAL HIGH (ref 5.0–8.0)

## 2014-07-02 LAB — COMPREHENSIVE METABOLIC PANEL
ALK PHOS: 60 U/L (ref 39–117)
ALT: 12 U/L (ref 0–35)
ANION GAP: 10 (ref 5–15)
AST: 16 U/L (ref 0–37)
Albumin: 4 g/dL (ref 3.5–5.2)
BUN: 9 mg/dL (ref 6–23)
CO2: 25 mEq/L (ref 19–32)
Calcium: 9 mg/dL (ref 8.4–10.5)
Chloride: 109 mEq/L (ref 96–112)
Creatinine, Ser: 0.82 mg/dL (ref 0.50–1.10)
GFR calc non Af Amer: >90 mL/min (ref >90)
GLUCOSE: 111 mg/dL — AB (ref 70–99)
POTASSIUM: 3.9 meq/L (ref 3.7–5.3)
Sodium: 144 mEq/L (ref 137–147)
Total Bilirubin: 0.4 mg/dL (ref 0.3–1.2)
Total Protein: 8 g/dL (ref 6.0–8.3)

## 2014-07-02 LAB — URINE MICROSCOPIC-ADD ON

## 2014-07-02 LAB — TROPONIN I: Troponin I: <0.3 ng/mL (ref <0.30)

## 2014-07-02 LAB — WET PREP, GENITAL
TRICH WET PREP: NONE SEEN
Yeast Wet Prep HPF POC: NONE SEEN

## 2014-07-02 LAB — CBC WITH DIFFERENTIAL/PLATELET
Basophils Absolute: 0.1 10*3/uL (ref 0.0–0.1)
Basophils Relative: 1 % (ref 0–1)
EOS PCT: 0 % (ref 0–5)
Eosinophils Absolute: 0 10*3/uL (ref 0.0–0.7)
HCT: 39.5 % (ref 36.0–46.0)
HEMOGLOBIN: 13.2 g/dL (ref 12.0–15.0)
LYMPHS ABS: 1.1 10*3/uL (ref 0.7–4.0)
Lymphocytes Relative: 18 % (ref 12–46)
MCH: 31.4 pg (ref 26.0–34.0)
MCHC: 33.4 g/dL (ref 30.0–36.0)
MCV: 93.8 fL (ref 78.0–100.0)
Monocytes Absolute: 0.3 10*3/uL (ref 0.1–1.0)
Monocytes Relative: 4 % (ref 3–12)
NEUTROS PCT: 77 % (ref 43–77)
Neutro Abs: 4.8 10*3/uL (ref 1.7–7.7)
Platelets: 318 10*3/uL (ref 150–400)
RBC: 4.21 MIL/uL (ref 3.87–5.11)
RDW: 12.9 % (ref 11.5–15.5)
WBC: 6.3 10*3/uL (ref 4.0–10.5)

## 2014-07-02 LAB — LIPASE, BLOOD: LIPASE: 12 U/L (ref 11–59)

## 2014-07-02 LAB — RPR

## 2014-07-02 LAB — POC URINE PREG, ED: Preg Test, Ur: NEGATIVE

## 2014-07-02 MED ORDER — METRONIDAZOLE 500 MG PO TABS: 500.0000 mg | ORAL_TABLET | Freq: Two times a day (BID) | ORAL | Status: DC

## 2014-07-02 MED ORDER — ONDANSETRON HCL 4 MG/2ML IJ SOLN
4.0000 mg | Freq: Once | INTRAMUSCULAR | Status: AC
  Administered 2014-07-02: 4 mg via INTRAVENOUS

## 2014-07-02 MED ORDER — PROMETHAZINE HCL 25 MG PO TABS: 25.0000 mg | ORAL_TABLET | Freq: Four times a day (QID) | ORAL | Status: DC | PRN

## 2014-07-02 MED ORDER — ONDANSETRON HCL 4 MG/2ML IJ SOLN
INTRAMUSCULAR | Status: AC
  Filled 2014-07-02: qty 2

## 2014-07-02 MED ORDER — KETOROLAC TROMETHAMINE 30 MG/ML IJ SOLN
30.0000 mg | Freq: Once | INTRAMUSCULAR | Status: AC
  Administered 2014-07-02: 30 mg via INTRAVENOUS
  Filled 2014-07-02: qty 1

## 2014-07-02 MED ORDER — SODIUM CHLORIDE 0.9 % IV BOLUS (SEPSIS)
1000.0000 mL | Freq: Once | INTRAVENOUS | Status: AC
  Administered 2014-07-02: 1000 mL via INTRAVENOUS

## 2014-07-02 MED ORDER — METOCLOPRAMIDE HCL 5 MG/ML IJ SOLN
10.0000 mg | Freq: Once | INTRAMUSCULAR | Status: AC
  Administered 2014-07-02: 10 mg via INTRAVENOUS
  Filled 2014-07-02: qty 2
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
## 2014-07-02 NOTE — ED Notes (Signed)
Patient with no complaints at this time. Respirations even and unlabored. Skin warm/dry. Discharge instructions reviewed with patient at this time. Patient given opportunity to voice concerns/ask questions. IV removed per policy and band-aid applied to site. Patient discharged at this time and left Emergency Department with steady gait.  

## 2014-07-02 NOTE — ED Notes (Signed)
PT c/o n/v x2 days and diarrhea that started this am. PT c/o cramping abdominal pain all over. PT denies any urinary symptoms.

## 2014-07-02 NOTE — Discharge Instructions (Signed)
Bacterial Vaginosis Bacterial vaginosis is a vaginal infection that occurs when the normal balance of bacteria in the vagina is disrupted. It results from an overgrowth of certain bacteria. This is the most common vaginal infection in women of childbearing age. Treatment is important to prevent complications, especially in pregnant women, as it can cause a premature delivery. CAUSES  Bacterial vaginosis is caused by an increase in harmful bacteria that are normally present in smaller amounts in the vagina. Several different kinds of bacteria can cause bacterial vaginosis. However, the reason that the condition develops is not fully understood. RISK FACTORS Certain activities or behaviors can put you at an increased risk of developing bacterial vaginosis, including:  Having a new sex partner or multiple sex partners.  Douching.  Using an intrauterine device (IUD) for contraception. Women do not get bacterial vaginosis from toilet seats, bedding, swimming pools, or contact with objects around them. SIGNS AND SYMPTOMS  Some women with bacterial vaginosis have no signs or symptoms. Common symptoms include:  Grey vaginal discharge.  A fishlike odor with discharge, especially after sexual intercourse.  Itching or burning of the vagina and vulva.  Burning or pain with urination. DIAGNOSIS  Your health care provider will take a medical history and examine the vagina for signs of bacterial vaginosis. A sample of vaginal fluid may be taken. Your health care provider will look at this sample under a microscope to check for bacteria and abnormal cells. A vaginal pH test may also be done.  TREATMENT  Bacterial vaginosis may be treated with antibiotic medicines. These may be given in the form of a pill or a vaginal cream. A second round of antibiotics may be prescribed if the condition comes back after treatment.  HOME CARE INSTRUCTIONS   Only take over-the-counter or prescription medicines as  directed by your health care provider.  If antibiotic medicine was prescribed, take it as directed. Make sure you finish it even if you start to feel better.  Do not have sex until treatment is completed.  Tell all sexual partners that you have a vaginal infection. They should see their health care provider and be treated if they have problems, such as a mild rash or itching.  Practice safe sex by using condoms and only having one sex partner. SEEK MEDICAL CARE IF:   Your symptoms are not improving after 3 days of treatment.  You have increased discharge or pain.  You have a fever. MAKE SURE YOU:   Understand these instructions.  Will watch your condition.  Will get help right away if you are not doing well or get worse. FOR MORE INFORMATION  Centers for Disease Control and Prevention, Division of STD Prevention: SolutionApps.co.za American Sexual Health Association (ASHA): www.ashastd.org  Document Released: 08/14/2005 Document Revised: 06/04/2013 Document Reviewed: 03/26/2013 [facility] Patient Information 2015 [place], [place]. This information is not intended to replace advice given to you by your health care provider. Make sure you discuss any questions you have with your health care provider.  Headaches, Frequently Asked Questions MIGRAINE HEADACHES Q: What is migraine? What causes it? How can I treat it? A: Generally, migraine headaches begin as a dull ache. Then they develop into a constant, throbbing, and pulsating pain. You may experience pain at the temples. You may experience pain at the front or back of one or both sides of the head. The pain is usually accompanied by a combination of:  Nausea.  Vomiting.  Sensitivity to light and noise. Some people (about 15%)  experience an aura (see below) before an attack. The cause of migraine is believed to be chemical reactions in the brain. Treatment for migraine may include over-the-counter or prescription medications. It may  also include self-help techniques. These include relaxation training and biofeedback.  Q: What is an aura? A: About 15% of people with migraine get an "aura". This is a sign of neurological symptoms that occur before a migraine headache. You may see wavy or jagged lines, dots, or flashing lights. You might experience tunnel vision or blind spots in one or both eyes. The aura can include visual or auditory hallucinations (something imagined). It may include disruptions in smell (such as strange odors), taste or touch. Other symptoms include:  Numbness.  A "pins and needles" sensation.  Difficulty in recalling or speaking the correct word. These neurological events may last as long as 60 minutes. These symptoms will fade as the headache begins. Q: What is a trigger? A: Certain physical or environmental factors can lead to or "trigger" a migraine. These include:  Foods.  Hormonal changes.  Weather.  Stress. It is important to remember that triggers are different for everyone. To help prevent migraine attacks, you need to figure out which triggers affect you. Keep a headache diary. This is a good way to track triggers. The diary will help you talk to your healthcare professional about your condition. Q: Does weather affect migraines? A: Bright sunshine, hot, humid conditions, and drastic changes in barometric pressure may lead to, or "trigger," a migraine attack in some people. But studies have shown that weather does not act as a trigger for everyone with migraines. Q: What is the link between migraine and hormones? A: Hormones start and regulate many of your body's functions. Hormones keep your body in balance within a constantly changing environment. The levels of hormones in your body are unbalanced at times. Examples are during menstruation, pregnancy, or menopause. That can lead to a migraine attack. In fact, about three quarters of all women with migraine report that their attacks are  related to the menstrual cycle.  Q: Is there an increased risk of stroke for migraine sufferers? A: The likelihood of a migraine attack causing a stroke is very remote. That is not to say that migraine sufferers cannot have a stroke associated with their migraines. In persons under age 32, the most common associated factor for stroke is migraine headache. But over the course of a person's normal life span, the occurrence of migraine headache may actually be associated with a reduced risk of dying from cerebrovascular disease due to stroke.  Q: What are acute medications for migraine? A: Acute medications are used to treat the pain of the headache after it has started. Examples over-the-counter medications, NSAIDs, ergots, and triptans.  Q: What are the triptans? A: Triptans are the newest class of abortive medications. They are specifically targeted to treat migraine. Triptans are vasoconstrictors. They moderate some chemical reactions in the brain. The triptans work on receptors in your brain. Triptans help to restore the balance of a neurotransmitter called serotonin. Fluctuations in levels of serotonin are thought to be a main cause of migraine.  Q: Are over-the-counter medications for migraine effective? A: Over-the-counter, or "OTC," medications may be effective in relieving mild to moderate pain and associated symptoms of migraine. But you should see your caregiver before beginning any treatment regimen for migraine.  Q: What are preventive medications for migraine? A: Preventive medications for migraine are sometimes referred to as "prophylactic" treatments.  They are used to reduce the frequency, severity, and length of migraine attacks. Examples of preventive medications include antiepileptic medications, antidepressants, beta-blockers, calcium channel blockers, and NSAIDs (nonsteroidal anti-inflammatory drugs). Q: Why are anticonvulsants used to treat migraine? A: During the past few years,  there has been an increased interest in antiepileptic drugs for the prevention of migraine. They are sometimes referred to as "anticonvulsants". Both epilepsy and migraine may be caused by similar reactions in the brain.  Q: Why are antidepressants used to treat migraine? A: Antidepressants are typically used to treat people with depression. They may reduce migraine frequency by regulating chemical levels, such as serotonin, in the brain.  Q: What alternative therapies are used to treat migraine? A: The term "alternative therapies" is often used to describe treatments considered outside the scope of conventional Western medicine. Examples of alternative therapy include acupuncture, acupressure, and yoga. Another common alternative treatment is herbal therapy. Some herbs are believed to relieve headache pain. Always discuss alternative therapies with your caregiver before proceeding. Some herbal products contain arsenic and other toxins. TENSION HEADACHES Q: What is a tension-type headache? What causes it? How can I treat it? A: Tension-type headaches occur randomly. They are often the result of temporary stress, anxiety, fatigue, or anger. Symptoms include soreness in your temples, a tightening band-like sensation around your head (a "vice-like" ache). Symptoms can also include a pulling feeling, pressure sensations, and contracting head and neck muscles. The headache begins in your forehead, temples, or the back of your head and neck. Treatment for tension-type headache may include over-the-counter or prescription medications. Treatment may also include self-help techniques such as relaxation training and biofeedback. CLUSTER HEADACHES Q: What is a cluster headache? What causes it? How can I treat it? A: Cluster headache gets its name because the attacks come in groups. The pain arrives with little, if any, warning. It is usually on one side of the head. A tearing or bloodshot eye and a runny nose on the  same side of the headache may also accompany the pain. Cluster headaches are believed to be caused by chemical reactions in the brain. They have been described as the most severe and intense of any headache type. Treatment for cluster headache includes prescription medication and oxygen. SINUS HEADACHES Q: What is a sinus headache? What causes it? How can I treat it? A: When a cavity in the bones of the face and skull (a sinus) becomes inflamed, the inflammation will cause localized pain. This condition is usually the result of an allergic reaction, a tumor, or an infection. If your headache is caused by a sinus blockage, such as an infection, you will probably have a fever. An x-ray will confirm a sinus blockage. Your caregiver's treatment might include antibiotics for the infection, as well as antihistamines or decongestants.  REBOUND HEADACHES Q: What is a rebound headache? What causes it? How can I treat it? A: A pattern of taking acute headache medications too often can lead to a condition known as "rebound headache." A pattern of taking too much headache medication includes taking it more than 2 days per week or in excessive amounts. That means more than the label or a caregiver advises. With rebound headaches, your medications not only stop relieving pain, they actually begin to cause headaches. Doctors treat rebound headache by tapering the medication that is being overused. Sometimes your caregiver will gradually substitute a different type of treatment or medication. Stopping may be a challenge. Regularly overusing a medication increases the  potential for serious side effects. Consult a caregiver if you regularly use headache medications more than 2 days per week or more than the label advises. ADDITIONAL QUESTIONS AND ANSWERS Q: What is biofeedback? A: Biofeedback is a self-help treatment. Biofeedback uses special equipment to monitor your body's involuntary physical responses. Biofeedback  monitors:  Breathing.  Pulse.  Heart rate.  Temperature.  Muscle tension.  Brain activity. Biofeedback helps you refine and perfect your relaxation exercises. You learn to control the physical responses that are related to stress. Once the technique has been mastered, you do not need the equipment any more. Q: Are headaches hereditary? A: Four out of five (80%) of people that suffer report a family history of migraine. Scientists are not sure if this is genetic or a family predisposition. Despite the uncertainty, a child has a 50% chance of having migraine if one parent suffers. The child has a 75% chance if both parents suffer.  Q: Can children get headaches? A: By the time they reach high school, most young people have experienced some type of headache. Many safe and effective approaches or medications can prevent a headache from occurring or stop it after it has begun.  Q: What type of doctor should I see to diagnose and treat my headache? A: Start with your primary caregiver. Discuss his or her experience and approach to headaches. Discuss methods of classification, diagnosis, and treatment. Your caregiver may decide to recommend you to a headache specialist, depending upon your symptoms or other physical conditions. Having diabetes, allergies, etc., may require a more comprehensive and inclusive approach to your headache. The National Headache Foundation will provide, upon request, a list of [facility] physician members in your state. Document Released: 11/04/2003 Document Revised: 11/06/2011 Document Reviewed: 04/13/2008 [facility] Patient Information 2015 [place], [place]. This information is not intended to replace advice given to you by your health care provider. Make sure you discuss any questions you have with your health care provider.

## 2014-07-02 NOTE — ED Notes (Signed)
Telepsych in progress. 

## 2014-07-02 NOTE — ED Notes (Signed)
Actively vomiting. NKDA. Medicated with 4 mg Zofran IV per standing protocol.

## 2014-07-03 LAB — GC/CHLAMYDIA PROBE AMP
CT Probe RNA: NEGATIVE
GC PROBE AMP APTIMA: NEGATIVE

## 2014-07-03 LAB — HIV ANTIBODY (ROUTINE TESTING W REFLEX): HIV: NONREACTIVE

## 2014-08-09 ENCOUNTER — Emergency Department (HOSPITAL_COMMUNITY)
Admission: EM | Admit: 2014-08-09 | Discharge: 2014-08-09 | Disposition: A | Discharge status: Home / Self Care | Payer: Medicaid Other | Attending: Emergency Medicine | Admitting: Emergency Medicine

## 2014-08-09 ENCOUNTER — Encounter (HOSPITAL_COMMUNITY): Payer: Self-pay | Admitting: Emergency Medicine

## 2014-08-09 DIAGNOSIS — Z720 Tobacco use: Secondary | ICD-10-CM | POA: Insufficient documentation

## 2014-08-09 DIAGNOSIS — Z8719 Personal history of other diseases of the digestive system: Secondary | ICD-10-CM | POA: Diagnosis not present

## 2014-08-09 DIAGNOSIS — Z79899 Other long term (current) drug therapy: Secondary | ICD-10-CM | POA: Insufficient documentation

## 2014-08-09 DIAGNOSIS — Z8659 Personal history of other mental and behavioral disorders: Secondary | ICD-10-CM | POA: Insufficient documentation

## 2014-08-09 DIAGNOSIS — I10 Essential (primary) hypertension: Secondary | ICD-10-CM | POA: Insufficient documentation

## 2014-08-09 DIAGNOSIS — M65052 Abscess of tendon sheath, left thigh: Secondary | ICD-10-CM | POA: Insufficient documentation

## 2014-08-09 DIAGNOSIS — Z8619 Personal history of other infectious and parasitic diseases: Secondary | ICD-10-CM | POA: Insufficient documentation

## 2014-08-09 DIAGNOSIS — L02416 Cutaneous abscess of left lower limb: Secondary | ICD-10-CM

## 2014-08-09 MED ORDER — SULFAMETHOXAZOLE-TRIMETHOPRIM 800-160 MG PO TABS: 1.0000 | ORAL_TABLET | Freq: Two times a day (BID) | ORAL | Status: AC

## 2014-08-09 MED ORDER — HYDROCODONE-ACETAMINOPHEN 5-325 MG PO TABS: 1.0000 | ORAL_TABLET | ORAL | Status: DC | PRN

## 2014-08-09 MED ORDER — SODIUM CHLORIDE 0.9 % IJ SOLN
INTRAMUSCULAR | Status: AC
  Filled 2014-08-09: qty 10

## 2014-08-09 MED ORDER — LIDOCAINE HCL (PF) 2 % IJ SOLN
INTRAMUSCULAR | Status: AC
  Filled 2014-08-09: qty 10

## 2014-08-09 MED ORDER — POVIDONE-IODINE 10 % EX SOLN
CUTANEOUS | Status: AC
  Filled 2014-08-09: qty 118
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
## 2014-08-09 NOTE — ED Notes (Signed)
Patient c/o abscess in left groin. Per patient started 2 days ago and has progressively gotten worse. Denies any drainage or fevers.

## 2014-08-09 NOTE — Discharge Instructions (Signed)
Abscess °An abscess (boil or furuncle) is an infected area on or under the skin. This area is filled with yellowish-white fluid (pus) and other material (debris). °HOME CARE  °· Only take medicines as told by your doctor. °· If you were given antibiotic medicine, take it as directed. Finish the medicine even if you start to feel better. °· If gauze is used, follow your doctor's directions for changing the gauze. °· To avoid spreading the infection: °¨ Keep your abscess covered with a bandage. °¨ Wash your hands well. °¨ Do not share personal care items, towels, or whirlpools with others. °¨ Avoid skin contact with others. °· Keep your skin and clothes clean around the abscess. °· Keep all doctor visits as told. °GET HELP RIGHT AWAY IF:  °· You have more pain, puffiness (swelling), or redness in the wound site. °· You have more fluid or blood coming from the wound site. °· You have muscle aches, chills, or you feel sick. °· You have a fever. °MAKE SURE YOU:  °· Understand these instructions. °· Will watch your condition. °· Will get help right away if you are not doing well or get worse. °Document Released: 01/31/2008 Document Revised: 02/13/2012 Document Reviewed: 10/27/2011 °ExitCare® Patient Information ©2015 [employer]. This information is not intended to replace advice given to you by your health care provider. Make sure you discuss any questions you have with your health care provider. ° °

## 2015-01-30 ENCOUNTER — Emergency Department (HOSPITAL_COMMUNITY)
Admission: EM | Admit: 2015-01-30 | Discharge: 2015-01-30 | Disposition: A | Discharge status: Home / Self Care | Payer: Medicaid Other | Attending: Emergency Medicine | Admitting: Emergency Medicine

## 2015-01-30 ENCOUNTER — Encounter (HOSPITAL_COMMUNITY): Payer: Self-pay | Admitting: *Deleted

## 2015-01-30 DIAGNOSIS — Z8619 Personal history of other infectious and parasitic diseases: Secondary | ICD-10-CM | POA: Insufficient documentation

## 2015-01-30 DIAGNOSIS — I10 Essential (primary) hypertension: Secondary | ICD-10-CM | POA: Insufficient documentation

## 2015-01-30 DIAGNOSIS — L739 Follicular disorder, unspecified: Secondary | ICD-10-CM | POA: Diagnosis not present

## 2015-01-30 DIAGNOSIS — Z720 Tobacco use: Secondary | ICD-10-CM | POA: Insufficient documentation

## 2015-01-30 DIAGNOSIS — Z8659 Personal history of other mental and behavioral disorders: Secondary | ICD-10-CM | POA: Insufficient documentation

## 2015-01-30 DIAGNOSIS — Z8719 Personal history of other diseases of the digestive system: Secondary | ICD-10-CM | POA: Insufficient documentation

## 2015-01-30 DIAGNOSIS — L988 Other specified disorders of the skin and subcutaneous tissue: Secondary | ICD-10-CM | POA: Diagnosis present

## 2015-01-30 MED ORDER — CEPHALEXIN 500 MG PO CAPS: 500.0000 mg | ORAL_CAPSULE | Freq: Four times a day (QID) | ORAL | Status: DC

## 2015-01-30 NOTE — Discharge Instructions (Signed)
Soak your head in warm water, or use a warm compress on it 3 or 4 times a day for 30 minutes. Take ibuprofen or acetaminophen for pain. Start the antibiotic prescription as soon as possible.   Folliculitis  Folliculitis is redness, soreness, and swelling (inflammation) of the hair follicles. This condition can occur anywhere on the body. People with weakened immune systems, diabetes, or obesity have a greater risk of getting folliculitis. CAUSES  Bacterial infection. This is the most common cause.  Fungal infection.  Viral infection.  Contact with certain chemicals, especially oils and tars. Long-term folliculitis can result from bacteria that live in the nostrils. The bacteria may trigger multiple outbreaks of folliculitis over time. SYMPTOMS Folliculitis most commonly occurs on the scalp, thighs, legs, back, buttocks, and areas where hair is shaved frequently. An early sign of folliculitis is a small, white or yellow, pus-filled, itchy lesion (pustule). These lesions appear on a red, inflamed follicle. They are usually less than 0.2 inches (5 mm) wide. When there is an infection of the follicle that goes deeper, it becomes a boil or furuncle. A group of closely packed boils creates a larger lesion (carbuncle). Carbuncles tend to occur in hairy, sweaty areas of the body. DIAGNOSIS  Your caregiver can usually tell what is wrong by doing a physical exam. A sample may be taken from one of the lesions and tested in a lab. This can help determine what is causing your folliculitis. TREATMENT  Treatment may include:  Applying warm compresses to the affected areas.  Taking antibiotic medicines orally or applying them to the skin.  Draining the lesions if they contain a large amount of pus or fluid.  Laser hair removal for cases of long-lasting folliculitis. This helps to prevent regrowth of the hair. HOME CARE INSTRUCTIONS  Apply warm compresses to the affected areas as directed by your  caregiver.  If antibiotics are prescribed, take them as directed. Finish them even if you start to feel better.  You may take over-the-counter medicines to relieve itching.  Do not shave irritated skin.  Follow up with your caregiver as directed. SEEK IMMEDIATE MEDICAL CARE IF:   You have increasing redness, swelling, or pain in the affected area.  You have a fever. MAKE SURE YOU:  Understand these instructions.  Will watch your condition.  Will get help right away if you are not doing well or get worse. Document Released: 10/23/2001 Document Revised: 02/13/2012 Document Reviewed: 11/14/2011 [facility] Patient Information 2015 [place]. This information is not intended to replace advice given to you by your health care provider. Make sure you discuss any questions you have with your health care provider.

## 2015-01-30 NOTE — ED Notes (Signed)
MD at bedside. 
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
## 2015-01-30 NOTE — ED Notes (Addendum)
Pt has several areas of soreness to the head and neck. States this has occurred before and was treated with antibiotics. Pt states areas are very tender to the touch. Unsure if areas have been draining. First noticed 3 days ago. States sore throat as well. Also states dizziness and headache. Pt states she has been out of BP medication x 2 weeks.

## 2015-01-30 NOTE — ED Notes (Signed)
Pt reports headache and feeling tired, pt. Informed of need to maintain refill on BP, as being off her meds and BP being high today could be attributing to the way she is feeling. Pt. States she is going to call her PCP today as she thinks his office is open today til 1 pm.

## 2015-05-25 ENCOUNTER — Other Ambulatory Visit (HOSPITAL_COMMUNITY): Payer: Self-pay | Admitting: Obstetrics and Gynecology

## 2015-05-25 DIAGNOSIS — N926 Irregular menstruation, unspecified: Secondary | ICD-10-CM

## 2015-05-25 DIAGNOSIS — R102 Pelvic and perineal pain: Secondary | ICD-10-CM

## 2015-05-31 ENCOUNTER — Ambulatory Visit (HOSPITAL_COMMUNITY)
Admission: RE | Admit: 2015-05-31 | Discharge: 2015-05-31 | Disposition: A | Discharge status: Home / Self Care | Payer: Medicaid Other | Source: Ambulatory Visit | Attending: Obstetrics and Gynecology | Admitting: Obstetrics and Gynecology

## 2015-05-31 DIAGNOSIS — N926 Irregular menstruation, unspecified: Secondary | ICD-10-CM | POA: Diagnosis not present

## 2015-05-31 DIAGNOSIS — R102 Pelvic and perineal pain: Secondary | ICD-10-CM | POA: Insufficient documentation

## 2015-07-04 ENCOUNTER — Emergency Department (HOSPITAL_COMMUNITY)
Admission: EM | Admit: 2015-07-04 | Discharge: 2015-07-04 | Disposition: A | Discharge status: Home / Self Care | Payer: Medicaid Other | Attending: Emergency Medicine | Admitting: Emergency Medicine

## 2015-07-04 ENCOUNTER — Encounter (HOSPITAL_COMMUNITY): Payer: Self-pay | Admitting: *Deleted

## 2015-07-04 DIAGNOSIS — Z79899 Other long term (current) drug therapy: Secondary | ICD-10-CM | POA: Diagnosis not present

## 2015-07-04 DIAGNOSIS — Z8719 Personal history of other diseases of the digestive system: Secondary | ICD-10-CM | POA: Insufficient documentation

## 2015-07-04 DIAGNOSIS — Z792 Long term (current) use of antibiotics: Secondary | ICD-10-CM | POA: Insufficient documentation

## 2015-07-04 DIAGNOSIS — Z8659 Personal history of other mental and behavioral disorders: Secondary | ICD-10-CM | POA: Diagnosis not present

## 2015-07-04 DIAGNOSIS — Z720 Tobacco use: Secondary | ICD-10-CM | POA: Insufficient documentation

## 2015-07-04 DIAGNOSIS — Z8619 Personal history of other infectious and parasitic diseases: Secondary | ICD-10-CM | POA: Diagnosis not present

## 2015-07-04 DIAGNOSIS — R51 Headache: Secondary | ICD-10-CM | POA: Diagnosis not present

## 2015-07-04 DIAGNOSIS — I10 Essential (primary) hypertension: Secondary | ICD-10-CM | POA: Insufficient documentation

## 2015-07-04 DIAGNOSIS — R519 Headache, unspecified: Secondary | ICD-10-CM

## 2015-07-04 MED ORDER — KETOROLAC TROMETHAMINE 60 MG/2ML IM SOLN
60.0000 mg | Freq: Once | INTRAMUSCULAR | Status: AC
  Administered 2015-07-04: 60 mg via INTRAMUSCULAR
  Filled 2015-07-04: qty 2

## 2015-07-04 MED ORDER — AMLODIPINE BESYLATE 5 MG PO TABS
5.0000 mg | ORAL_TABLET | Freq: Once | ORAL | Status: AC
  Administered 2015-07-04: 5 mg via ORAL
  Filled 2015-07-04: qty 1

## 2015-07-04 MED ORDER — AMLODIPINE BESYLATE 10 MG PO TABS: 10.0000 mg | ORAL_TABLET | Freq: Every day | ORAL | Status: DC

## 2015-07-04 NOTE — Discharge Instructions (Signed)
Tylenol and/or ibuprofen for headache. Will restart your blood pressure medication. Follow-up your primary care doctor.
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
## 2015-07-04 NOTE — ED Notes (Signed)
Pt c/o fatigue, HA and dizziness since 1000 yesterday morning

## 2015-10-17 ENCOUNTER — Emergency Department (HOSPITAL_COMMUNITY)
Admission: EM | Admit: 2015-10-17 | Discharge: 2015-10-17 | Disposition: A | Discharge status: Home / Self Care | Payer: Medicaid Other | Attending: Emergency Medicine | Admitting: Emergency Medicine

## 2015-10-17 ENCOUNTER — Encounter (HOSPITAL_COMMUNITY): Payer: Self-pay

## 2015-10-17 DIAGNOSIS — F17210 Nicotine dependence, cigarettes, uncomplicated: Secondary | ICD-10-CM | POA: Diagnosis not present

## 2015-10-17 DIAGNOSIS — Z87898 Personal history of other specified conditions: Secondary | ICD-10-CM

## 2015-10-17 DIAGNOSIS — Z79899 Other long term (current) drug therapy: Secondary | ICD-10-CM | POA: Diagnosis not present

## 2015-10-17 DIAGNOSIS — Z8719 Personal history of other diseases of the digestive system: Secondary | ICD-10-CM | POA: Diagnosis not present

## 2015-10-17 DIAGNOSIS — Z8619 Personal history of other infectious and parasitic diseases: Secondary | ICD-10-CM | POA: Insufficient documentation

## 2015-10-17 DIAGNOSIS — Z8659 Personal history of other mental and behavioral disorders: Secondary | ICD-10-CM | POA: Diagnosis not present

## 2015-10-17 DIAGNOSIS — Z87442 Personal history of urinary calculi: Secondary | ICD-10-CM | POA: Diagnosis not present

## 2015-10-17 DIAGNOSIS — R350 Frequency of micturition: Secondary | ICD-10-CM | POA: Diagnosis not present

## 2015-10-17 DIAGNOSIS — R109 Unspecified abdominal pain: Secondary | ICD-10-CM | POA: Diagnosis not present

## 2015-10-17 DIAGNOSIS — Z791 Long term (current) use of non-steroidal anti-inflammatories (NSAID): Secondary | ICD-10-CM | POA: Diagnosis not present

## 2015-10-17 DIAGNOSIS — Z760 Encounter for issue of repeat prescription: Secondary | ICD-10-CM | POA: Insufficient documentation

## 2015-10-17 DIAGNOSIS — I10 Essential (primary) hypertension: Secondary | ICD-10-CM | POA: Diagnosis not present

## 2015-10-17 LAB — CBC WITH DIFFERENTIAL/PLATELET
BASOS ABS: 0.1 10*3/uL (ref 0.0–0.1)
BASOS PCT: 1 %
EOS ABS: 0.1 10*3/uL (ref 0.0–0.7)
Eosinophils Relative: 2 %
HEMATOCRIT: 39.6 % (ref 36.0–46.0)
Hemoglobin: 13.1 g/dL (ref 12.0–15.0)
Lymphocytes Relative: 39 %
Lymphs Abs: 1.9 10*3/uL (ref 0.7–4.0)
MCH: 31 pg (ref 26.0–34.0)
MCHC: 33.1 g/dL (ref 30.0–36.0)
MCV: 93.8 fL (ref 78.0–100.0)
MONO ABS: 0.3 10*3/uL (ref 0.1–1.0)
Monocytes Relative: 6 %
NEUTROS ABS: 2.6 10*3/uL (ref 1.7–7.7)
NEUTROS PCT: 52 %
PLATELETS: 322 10*3/uL (ref 150–400)
RBC: 4.22 MIL/uL (ref 3.87–5.11)
RDW: 13.3 % (ref 11.5–15.5)
WBC: 4.9 10*3/uL (ref 4.0–10.5)

## 2015-10-17 LAB — BASIC METABOLIC PANEL
ANION GAP: 5 (ref 5–15)
BUN: 13 mg/dL (ref 6–20)
CALCIUM: 9 mg/dL (ref 8.9–10.3)
CO2: 25 mmol/L (ref 22–32)
CREATININE: 0.86 mg/dL (ref 0.44–1.00)
Chloride: 109 mmol/L (ref 101–111)
Glucose, Bld: 97 mg/dL (ref 65–99)
Potassium: 4.2 mmol/L (ref 3.5–5.1)
SODIUM: 139 mmol/L (ref 135–145)

## 2015-10-17 LAB — URINALYSIS, ROUTINE W REFLEX MICROSCOPIC
BILIRUBIN URINE: NEGATIVE
Glucose, UA: NEGATIVE mg/dL
Hgb urine dipstick: NEGATIVE
KETONES UR: NEGATIVE mg/dL
LEUKOCYTES UA: NEGATIVE
NITRITE: NEGATIVE
PROTEIN: NEGATIVE mg/dL
Specific Gravity, Urine: 1.02 (ref 1.005–1.030)
pH: 6 (ref 5.0–8.0)

## 2015-10-17 LAB — PREGNANCY, URINE: PREG TEST UR: NEGATIVE

## 2015-10-17 MED ORDER — AMLODIPINE BESYLATE 10 MG PO TABS: 10.0000 mg | ORAL_TABLET | Freq: Every day | ORAL | Status: DC

## 2015-10-17 MED ORDER — IBUPROFEN 600 MG PO TABS: 600.0000 mg | ORAL_TABLET | Freq: Four times a day (QID) | ORAL | Status: DC | PRN

## 2015-10-17 NOTE — ED Notes (Signed)
Pt reports has been out of her bp medication x 3 weeks.  She said she missed her annual physical so she didn't get them refilled.  Pt says she can't remember the name of her medication.
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
## 2015-10-17 NOTE — ED Notes (Signed)
Patient noted to have high BP. Inquired about medication, states she is out and has been unable to see MD. Leanora Ivanoff Norvasc 10 mg. Burgess Amor aware and wrote Rx for same. Given to patient and advised of importance of follow up with PMD for continued management. Patient with no complaints at this time. Respirations even and unlabored. Skin warm/dry. Discharge instructions reviewed with patient at this time. Patient given opportunity to voice concerns/ask questions. Patient discharged at this time and left Emergency Department with steady gait.

## 2015-10-17 NOTE — ED Notes (Signed)
Pt reports r flank pain x 3 days.  Denies urinary symptoms.  Reports history of kidney stones.

## 2016-01-31 ENCOUNTER — Encounter (HOSPITAL_COMMUNITY): Payer: Self-pay | Admitting: Emergency Medicine

## 2016-01-31 ENCOUNTER — Emergency Department (HOSPITAL_COMMUNITY)
Admission: EM | Admit: 2016-01-31 | Discharge: 2016-01-31 | Disposition: A | Discharge status: Home / Self Care | Payer: Medicaid Other | Attending: Emergency Medicine | Admitting: Emergency Medicine

## 2016-01-31 DIAGNOSIS — R350 Frequency of micturition: Secondary | ICD-10-CM | POA: Diagnosis not present

## 2016-01-31 DIAGNOSIS — Z87891 Personal history of nicotine dependence: Secondary | ICD-10-CM | POA: Insufficient documentation

## 2016-01-31 DIAGNOSIS — N939 Abnormal uterine and vaginal bleeding, unspecified: Secondary | ICD-10-CM | POA: Insufficient documentation

## 2016-01-31 DIAGNOSIS — R1030 Lower abdominal pain, unspecified: Secondary | ICD-10-CM | POA: Diagnosis present

## 2016-01-31 DIAGNOSIS — R1110 Vomiting, unspecified: Secondary | ICD-10-CM | POA: Diagnosis not present

## 2016-01-31 DIAGNOSIS — Z79899 Other long term (current) drug therapy: Secondary | ICD-10-CM | POA: Insufficient documentation

## 2016-01-31 DIAGNOSIS — I10 Essential (primary) hypertension: Secondary | ICD-10-CM | POA: Insufficient documentation

## 2016-01-31 DIAGNOSIS — F329 Major depressive disorder, single episode, unspecified: Secondary | ICD-10-CM | POA: Insufficient documentation

## 2016-01-31 LAB — CBC WITH DIFFERENTIAL/PLATELET
BASOS ABS: 0 10*3/uL (ref 0.0–0.1)
BASOS PCT: 1 %
Eosinophils Absolute: 0.1 10*3/uL (ref 0.0–0.7)
Eosinophils Relative: 2 %
HEMATOCRIT: 35.5 % — AB (ref 36.0–46.0)
Hemoglobin: 11.6 g/dL — ABNORMAL LOW (ref 12.0–15.0)
LYMPHS PCT: 40 %
Lymphs Abs: 2.2 10*3/uL (ref 0.7–4.0)
MCH: 30.8 pg (ref 26.0–34.0)
MCHC: 32.7 g/dL (ref 30.0–36.0)
MCV: 94.2 fL (ref 78.0–100.0)
Monocytes Absolute: 0.4 10*3/uL (ref 0.1–1.0)
Monocytes Relative: 7 %
NEUTROS ABS: 2.8 10*3/uL (ref 1.7–7.7)
Neutrophils Relative %: 50 %
PLATELETS: 324 10*3/uL (ref 150–400)
RBC: 3.77 MIL/uL — AB (ref 3.87–5.11)
RDW: 13.3 % (ref 11.5–15.5)
WBC: 5.5 10*3/uL (ref 4.0–10.5)

## 2016-01-31 LAB — LIPASE, BLOOD: Lipase: 14 U/L (ref 11–51)

## 2016-01-31 LAB — URINALYSIS, ROUTINE W REFLEX MICROSCOPIC
BILIRUBIN URINE: NEGATIVE
GLUCOSE, UA: NEGATIVE mg/dL
KETONES UR: NEGATIVE mg/dL
Leukocytes, UA: NEGATIVE
NITRITE: NEGATIVE
PROTEIN: NEGATIVE mg/dL
Specific Gravity, Urine: 1.01 (ref 1.005–1.030)
pH: 6.5 (ref 5.0–8.0)

## 2016-01-31 LAB — URINE MICROSCOPIC-ADD ON
Bacteria, UA: NONE SEEN
SQUAMOUS EPITHELIAL / LPF: NONE SEEN
WBC UA: NONE SEEN WBC/hpf (ref 0–5)

## 2016-01-31 LAB — COMPREHENSIVE METABOLIC PANEL
ALK PHOS: 44 U/L (ref 38–126)
ALT: 18 U/L (ref 14–54)
ANION GAP: 4 — AB (ref 5–15)
AST: 20 U/L (ref 15–41)
Albumin: 3.8 g/dL (ref 3.5–5.0)
BILIRUBIN TOTAL: 0.4 mg/dL (ref 0.3–1.2)
BUN: 12 mg/dL (ref 6–20)
CALCIUM: 8.7 mg/dL — AB (ref 8.9–10.3)
CO2: 25 mmol/L (ref 22–32)
Chloride: 109 mmol/L (ref 101–111)
Creatinine, Ser: 0.9 mg/dL (ref 0.44–1.00)
Glucose, Bld: 98 mg/dL (ref 65–99)
POTASSIUM: 4.1 mmol/L (ref 3.5–5.1)
Sodium: 138 mmol/L (ref 135–145)
TOTAL PROTEIN: 7.1 g/dL (ref 6.5–8.1)

## 2016-01-31 LAB — PREGNANCY, URINE: Preg Test, Ur: NEGATIVE

## 2016-01-31 LAB — WET PREP, GENITAL
SPERM: NONE SEEN
TRICH WET PREP: NONE SEEN
WBC, Wet Prep HPF POC: NONE SEEN
YEAST WET PREP: NONE SEEN

## 2016-01-31 MED ORDER — ONDANSETRON 8 MG PO TBDP
8.0000 mg | ORAL_TABLET | Freq: Once | ORAL | Status: AC
  Administered 2016-01-31: 8 mg via ORAL
  Filled 2016-01-31: qty 1

## 2016-01-31 NOTE — ED Notes (Addendum)
Pt c/o lower abd pain radiating to lower back and intermittent vaginal bleeding x 3 days. Pt also reports 1 episode n/v this am. Pt states she had last depo shot x 5 months ago and has not had a period since, wants pregnancy test.
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
## 2016-01-31 NOTE — Discharge Instructions (Signed)
Abnormal Uterine Bleeding °Abnormal uterine bleeding means bleeding from the vagina that is not your normal menstrual period. This can be: °· Bleeding or spotting between periods. °· Bleeding after sex (sexual intercourse). °· Bleeding that is heavier or more than normal. °· Periods that last longer than usual. °· Bleeding after menopause. °There are many problems that may cause this. Treatment will depend on the cause of the bleeding. Any kind of bleeding that is not normal should be reviewed by your doctor.  °HOME CARE °Watch your condition for any changes. These actions may lessen any discomfort you are having: °· Do not use tampons or douches as told by your doctor. °· Change your pads often. °You should get regular pelvic exams and Pap tests. Keep all appointments for tests as told by your doctor. °GET HELP IF: °· You are bleeding for more than 1 week. °· You feel dizzy at times. °GET HELP RIGHT AWAY IF:  °· You pass out. °· You have to change pads every 15 to 30 minutes. °· You have belly pain. °· You have a fever. °· You become sweaty or weak. °· You are passing large blood clots from the vagina. °· You feel sick to your stomach (nauseous) and throw up (vomit). °MAKE SURE YOU: °· Understand these instructions. °· Will watch your condition. °· Will get help right away if you are not doing well or get worse. °  °This information is not intended to replace advice given to you by your health care provider. Make sure you discuss any questions you have with your health care provider. °  °Document Released: 06/11/2009 Document Revised: 08/19/2013 Document Reviewed: 03/13/2013 °Elsevier Interactive Patient Education ©2016 [employer] ° °

## 2016-02-01 LAB — GC/CHLAMYDIA PROBE AMP (~~LOC~~) NOT AT ARMC
CHLAMYDIA, DNA PROBE: NEGATIVE
NEISSERIA GONORRHEA: NEGATIVE

## 2016-08-27 ENCOUNTER — Emergency Department (HOSPITAL_COMMUNITY)
Admission: EM | Admit: 2016-08-27 | Discharge: 2016-08-27 | Disposition: A | Discharge status: Home / Self Care | Payer: Medicaid Other | Attending: Emergency Medicine | Admitting: Emergency Medicine

## 2016-08-27 ENCOUNTER — Encounter (HOSPITAL_COMMUNITY): Payer: Self-pay | Admitting: Emergency Medicine

## 2016-08-27 DIAGNOSIS — F1290 Cannabis use, unspecified, uncomplicated: Secondary | ICD-10-CM | POA: Insufficient documentation

## 2016-08-27 DIAGNOSIS — B9789 Other viral agents as the cause of diseases classified elsewhere: Secondary | ICD-10-CM

## 2016-08-27 DIAGNOSIS — J069 Acute upper respiratory infection, unspecified: Secondary | ICD-10-CM | POA: Diagnosis not present

## 2016-08-27 DIAGNOSIS — Z79899 Other long term (current) drug therapy: Secondary | ICD-10-CM | POA: Diagnosis not present

## 2016-08-27 DIAGNOSIS — R110 Nausea: Secondary | ICD-10-CM | POA: Diagnosis not present

## 2016-08-27 DIAGNOSIS — R05 Cough: Secondary | ICD-10-CM | POA: Diagnosis present

## 2016-08-27 DIAGNOSIS — Z87891 Personal history of nicotine dependence: Secondary | ICD-10-CM | POA: Diagnosis not present

## 2016-08-27 DIAGNOSIS — I10 Essential (primary) hypertension: Secondary | ICD-10-CM | POA: Diagnosis not present

## 2016-08-27 MED ORDER — ACETAMINOPHEN 325 MG PO TABS
650.0000 mg | ORAL_TABLET | Freq: Once | ORAL | Status: AC
  Administered 2016-08-27: 650 mg via ORAL
  Filled 2016-08-27: qty 2

## 2016-08-27 MED ORDER — IBUPROFEN 400 MG PO TABS: 400.0000 mg | ORAL_TABLET | Freq: Three times a day (TID) | ORAL | 0 refills | Status: DC | PRN

## 2016-08-27 MED ORDER — ALBUTEROL SULFATE HFA 108 (90 BASE) MCG/ACT IN AERS: 2.0000 | INHALATION_SPRAY | Freq: Once | RESPIRATORY_TRACT | Status: DC

## 2016-08-27 MED ORDER — IBUPROFEN 400 MG PO TABS
600.0000 mg | ORAL_TABLET | Freq: Once | ORAL | Status: AC
  Administered 2016-08-27: 600 mg via ORAL
  Filled 2016-08-27: qty 2

## 2016-08-27 NOTE — ED Notes (Signed)
Pt states she does not want to wait on her inhaler.  Left ambulatory with steady gait.

## 2016-08-27 NOTE — ED Triage Notes (Signed)
Pt reports h/a x3 weeks and numbness down right arm and neck since last night. Pt has had cough and congestion and pain when coughing.  Pt alert and oriented.
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
## 2016-10-02 ENCOUNTER — Encounter: Payer: Self-pay | Admitting: Family Medicine

## 2016-10-02 ENCOUNTER — Encounter (INDEPENDENT_AMBULATORY_CARE_PROVIDER_SITE_OTHER): Payer: Self-pay

## 2016-10-02 ENCOUNTER — Ambulatory Visit (INDEPENDENT_AMBULATORY_CARE_PROVIDER_SITE_OTHER): Payer: Medicaid Other | Admitting: Family Medicine

## 2016-10-02 VITALS — BP 134/88 | HR 68 | Temp 98.3°F | Resp 16 | Ht 62.0 in | Wt 181.0 lb

## 2016-10-02 DIAGNOSIS — F79 Unspecified intellectual disabilities: Secondary | ICD-10-CM | POA: Diagnosis not present

## 2016-10-02 DIAGNOSIS — R519 Headache, unspecified: Secondary | ICD-10-CM | POA: Insufficient documentation

## 2016-10-02 DIAGNOSIS — O10919 Unspecified pre-existing hypertension complicating pregnancy, unspecified trimester: Secondary | ICD-10-CM | POA: Insufficient documentation

## 2016-10-02 DIAGNOSIS — Z23 Encounter for immunization: Secondary | ICD-10-CM | POA: Diagnosis not present

## 2016-10-02 DIAGNOSIS — Z720 Tobacco use: Secondary | ICD-10-CM

## 2016-10-02 DIAGNOSIS — G8929 Other chronic pain: Secondary | ICD-10-CM | POA: Insufficient documentation

## 2016-10-02 DIAGNOSIS — I10 Essential (primary) hypertension: Secondary | ICD-10-CM | POA: Diagnosis not present

## 2016-10-02 DIAGNOSIS — A6000 Herpesviral infection of urogenital system, unspecified: Secondary | ICD-10-CM | POA: Insufficient documentation

## 2016-10-02 DIAGNOSIS — A6004 Herpesviral vulvovaginitis: Secondary | ICD-10-CM

## 2016-10-02 DIAGNOSIS — R51 Headache: Secondary | ICD-10-CM

## 2016-10-02 DIAGNOSIS — Z7689 Persons encountering health services in other specified circumstances: Secondary | ICD-10-CM

## 2016-10-02 HISTORY — DX: Tobacco use: Z72.0

## 2016-10-02 LAB — URINALYSIS, ROUTINE W REFLEX MICROSCOPIC
Bilirubin Urine: NEGATIVE
Glucose, UA: NEGATIVE
Hgb urine dipstick: NEGATIVE
KETONES UR: NEGATIVE
LEUKOCYTES UA: NEGATIVE
NITRITE: NEGATIVE
PH: 6 (ref 5.0–8.0)
Protein, ur: NEGATIVE
SPECIFIC GRAVITY, URINE: 1.018 (ref 1.001–1.035)

## 2016-10-02 LAB — COMPREHENSIVE METABOLIC PANEL
ALBUMIN: 4 g/dL (ref 3.6–5.1)
ALT: 9 U/L (ref 6–29)
AST: 13 U/L (ref 10–30)
Alkaline Phosphatase: 48 U/L (ref 33–115)
BILIRUBIN TOTAL: 0.4 mg/dL (ref 0.2–1.2)
BUN: 12 mg/dL (ref 7–25)
CALCIUM: 9.4 mg/dL (ref 8.6–10.2)
CO2: 28 mmol/L (ref 20–31)
Chloride: 108 mmol/L (ref 98–110)
Creat: 0.82 mg/dL (ref 0.50–1.10)
Glucose, Bld: 80 mg/dL (ref 65–99)
Potassium: 4.7 mmol/L (ref 3.5–5.3)
Sodium: 138 mmol/L (ref 135–146)
TOTAL PROTEIN: 7 g/dL (ref 6.1–8.1)

## 2016-10-02 LAB — CBC
HEMATOCRIT: 37 % (ref 35.0–45.0)
HEMOGLOBIN: 12.1 g/dL (ref 11.7–15.5)
MCH: 29.8 pg (ref 27.0–33.0)
MCHC: 32.7 g/dL (ref 32.0–36.0)
MCV: 91.1 fL (ref 80.0–100.0)
MPV: 9.6 fL (ref 7.5–12.5)
Platelets: 358 10*3/uL (ref 140–400)
RBC: 4.06 MIL/uL (ref 3.80–5.10)
RDW: 13.9 % (ref 11.0–15.0)
WBC: 6.1 10*3/uL (ref 3.8–10.8)

## 2016-10-02 LAB — LIPID PANEL
CHOL/HDL RATIO: 2.7 ratio (ref <5.0)
CHOLESTEROL: 135 mg/dL (ref <200)
HDL: 50 mg/dL — ABNORMAL LOW (ref >50)
LDL Cholesterol: 61 mg/dL (ref <100)
Triglycerides: 120 mg/dL (ref <150)
VLDL: 24 mg/dL (ref <30)
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
## 2016-10-02 NOTE — Patient Instructions (Addendum)
Need old records  Try to cut down or quit smoking Eat well and take a vitamin  I will send you to a neurologist foe the headache  Need blood work  See me in a month or two

## 2016-10-03 ENCOUNTER — Encounter: Payer: Self-pay | Admitting: Family Medicine

## 2016-10-03 LAB — HEMOGLOBIN A1C
HEMOGLOBIN A1C: 5.3 % (ref <5.7)
MEAN PLASMA GLUCOSE: 105 mg/dL

## 2016-10-03 LAB — VITAMIN D 25 HYDROXY (VIT D DEFICIENCY, FRACTURES): VIT D 25 HYDROXY: 9 ng/mL — AB (ref 30–100)

## 2016-10-03 MED ORDER — VITAMIN D (ERGOCALCIFEROL) 1.25 MG (50000 UNIT) PO CAPS: 50000.0000 [IU] | ORAL_CAPSULE | ORAL | 0 refills | Status: DC

## 2016-11-07 ENCOUNTER — Ambulatory Visit: Payer: Medicaid Other | Admitting: Family Medicine

## 2016-11-13 ENCOUNTER — Encounter: Payer: Self-pay | Admitting: Family Medicine

## 2016-11-13 ENCOUNTER — Ambulatory Visit (INDEPENDENT_AMBULATORY_CARE_PROVIDER_SITE_OTHER): Payer: Medicaid Other | Admitting: Family Medicine

## 2016-11-13 VITALS — BP 178/102 | HR 68 | Temp 98.7°F | Resp 18 | Ht 62.0 in | Wt 187.0 lb

## 2016-11-13 DIAGNOSIS — I10 Essential (primary) hypertension: Secondary | ICD-10-CM | POA: Diagnosis not present

## 2016-11-13 DIAGNOSIS — Z720 Tobacco use: Secondary | ICD-10-CM

## 2016-11-13 DIAGNOSIS — G8929 Other chronic pain: Secondary | ICD-10-CM

## 2016-11-13 DIAGNOSIS — R51 Headache: Secondary | ICD-10-CM | POA: Diagnosis not present

## 2016-11-13 MED ORDER — LABETALOL HCL 100 MG PO TABS
100.0000 mg | ORAL_TABLET | Freq: Two times a day (BID) | ORAL | 11 refills | Status: DC
Start: 2016-11-13 — End: 2017-01-16

## 2016-11-13 NOTE — Patient Instructions (Addendum)
Try to quit smoking  Take the labetalol twice a day Come back next week for a BP check with the nurse  I have placed a referral to the neurologist  See me in 3 months  Be sure to take the Vitamin D 2000 u a day

## 2016-11-13 NOTE — Progress Notes (Signed)
    Chief Complaint  Patient presents with  . Follow-up   Here for routine follow up Continues with daily headache Her BP is up today She needs to be back on med The challenge is that she desires pregnancy I will try labetalol, it is safe during pregnancy and MAY help reduce the headaches. No new complaints  Patient Active Problem List   Diagnosis Date Noted  . Essential hypertension 10/02/2016  . Tobacco abuse 10/02/2016  . Mental disability 10/02/2016  . Genital herpes 10/02/2016  . Chronic nonintractable headache 10/02/2016    Outpatient Encounter Prescriptions as of 11/13/2016  Medication Sig  . cholecalciferol (VITAMIN D) 1000 units tablet Take 2,000 Units by mouth daily.  Marland Kitchen. ibuprofen (ADVIL,MOTRIN) 400 MG tablet Take 1 tablet (400 mg total) by mouth every 8 (eight) hours as needed.  . valACYclovir (VALTREX) 500 MG tablet Take 500 mg by mouth daily.   Marland Kitchen. labetalol (NORMODYNE) 100 MG tablet Take 1 tablet (100 mg total) by mouth 2 (two) times daily.   No facility-administered encounter medications on file as of 11/13/2016.     No Known Allergies  Review of Systems  Constitutional: Positive for fatigue. Negative for activity change, appetite change and unexpected weight change.  HENT: Negative for congestion, dental problem, postnasal drip, sinus pain and sinus pressure.   Eyes: Negative for photophobia and visual disturbance.  Respiratory: Negative for cough and shortness of breath.   Cardiovascular: Negative for chest pain and palpitations.  Gastrointestinal: Negative for constipation and diarrhea.  Genitourinary: Negative for menstrual problem.  Allergic/Immunologic: Negative for environmental allergies.  Neurological: Positive for headaches. Negative for dizziness.  Psychiatric/Behavioral: Negative for decreased concentration and dysphoric mood.    BP (!) 178/102 (BP Location: Right Arm, Patient Position: Sitting, Cuff Size: Normal)   Pulse 68   Temp 98.7 F (37.1  C) (Temporal)   Resp 18   Ht 5\' 2"  (1.575 m)   Wt 187 lb 0.3 oz (84.8 kg)   BMI 34.21 kg/m   Physical Exam  Constitutional: She appears well-developed and well-nourished. No distress.  HENT:  Head: Normocephalic and atraumatic.  Mouth/Throat: Oropharynx is clear and moist.  Eyes: Conjunctivae are normal. Pupils are equal, round, and reactive to light.  Discs flat  Neck: Normal range of motion. No thyromegaly present.  Cardiovascular: Normal rate and normal heart sounds.   Pulmonary/Chest: Effort normal and breath sounds normal. No respiratory distress.  Lymphadenopathy:    She has no cervical adenopathy.  Psychiatric:  Slow responses    ASSESSMENT/PLAN:  1. Chronic nonintractable headache, unspecified headache type  - Ambulatory referral to Neurology  2. Essential hypertension   3. Tobacco abuse    Patient Instructions  Try to quit smoking  Take the labetalol twice a day Come back next week for a BP check with the nurse  I have placed a referral to the neurologist  See me in 3 months  Be sure to take the Vitamin D 2000 u a day   Eustace MooreYvonne Sue Marley Pakula, MD

## 2016-11-22 ENCOUNTER — Telehealth: Payer: Self-pay | Admitting: Family Medicine

## 2016-11-22 NOTE — Telephone Encounter (Signed)
Paula from [facility] called to request notes/ more info for patient regarding referral she received. cb#: [phone] Fax#: [phone]

## 2016-11-23 NOTE — Telephone Encounter (Signed)
Notes copied and faxed. 

## 2016-12-29 ENCOUNTER — Emergency Department (HOSPITAL_COMMUNITY)
Admission: EM | Admit: 2016-12-29 | Discharge: 2016-12-29 | Disposition: A | Discharge status: Home / Self Care | Payer: Medicaid Other | Attending: Emergency Medicine | Admitting: Emergency Medicine

## 2016-12-29 ENCOUNTER — Emergency Department (HOSPITAL_COMMUNITY): Payer: Medicaid Other

## 2016-12-29 ENCOUNTER — Encounter (HOSPITAL_COMMUNITY): Payer: Self-pay | Admitting: *Deleted

## 2016-12-29 DIAGNOSIS — J180 Bronchopneumonia, unspecified organism: Secondary | ICD-10-CM | POA: Insufficient documentation

## 2016-12-29 DIAGNOSIS — F17210 Nicotine dependence, cigarettes, uncomplicated: Secondary | ICD-10-CM | POA: Insufficient documentation

## 2016-12-29 DIAGNOSIS — R1110 Vomiting, unspecified: Secondary | ICD-10-CM | POA: Diagnosis not present

## 2016-12-29 DIAGNOSIS — I10 Essential (primary) hypertension: Secondary | ICD-10-CM | POA: Diagnosis not present

## 2016-12-29 DIAGNOSIS — I309 Acute pericarditis, unspecified: Secondary | ICD-10-CM | POA: Diagnosis not present

## 2016-12-29 DIAGNOSIS — R079 Chest pain, unspecified: Secondary | ICD-10-CM | POA: Diagnosis present

## 2016-12-29 LAB — BASIC METABOLIC PANEL
Anion gap: 8 (ref 5–15)
BUN: 10 mg/dL (ref 6–20)
CHLORIDE: 103 mmol/L (ref 101–111)
CO2: 25 mmol/L (ref 22–32)
CREATININE: 0.88 mg/dL (ref 0.44–1.00)
Calcium: 8.9 mg/dL (ref 8.9–10.3)
GFR calc non Af Amer: >60 mL/min (ref >60)
Glucose, Bld: 107 mg/dL — ABNORMAL HIGH (ref 65–99)
Potassium: 3.7 mmol/L (ref 3.5–5.1)
Sodium: 136 mmol/L (ref 135–145)

## 2016-12-29 LAB — CBC
HCT: 39 % (ref 36.0–46.0)
Hemoglobin: 12.9 g/dL (ref 12.0–15.0)
MCH: 30.1 pg (ref 26.0–34.0)
MCHC: 33.1 g/dL (ref 30.0–36.0)
MCV: 91.1 fL (ref 78.0–100.0)
PLATELETS: 361 10*3/uL (ref 150–400)
RBC: 4.28 MIL/uL (ref 3.87–5.11)
RDW: 13.4 % (ref 11.5–15.5)
WBC: 5.9 10*3/uL (ref 4.0–10.5)

## 2016-12-29 LAB — TROPONIN I: Troponin I: <0.03 ng/mL (ref <0.03)

## 2016-12-29 MED ORDER — KETOROLAC TROMETHAMINE 30 MG/ML IJ SOLN
15.0000 mg | Freq: Once | INTRAMUSCULAR | Status: AC
  Administered 2016-12-29: 15 mg via INTRAVENOUS
  Filled 2016-12-29: qty 1

## 2016-12-29 MED ORDER — AZITHROMYCIN 250 MG PO TABS: 250.0000 mg | ORAL_TABLET | Freq: Every day | ORAL | 0 refills | Status: DC

## 2016-12-29 MED ORDER — ASPIRIN 81 MG PO CHEW
324.0000 mg | CHEWABLE_TABLET | Freq: Once | ORAL | Status: AC
  Administered 2016-12-29: 324 mg via ORAL
  Filled 2016-12-29: qty 4

## 2016-12-29 MED ORDER — COLCHICINE 0.6 MG PO TABS: 0.6000 mg | ORAL_TABLET | Freq: Every day | ORAL | 0 refills | Status: DC
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
## 2016-12-29 NOTE — ED Triage Notes (Signed)
Pt c/o mid chest pain that radiates to right shoulder since last night. Pt reports the pain is worse with deep breathing and coughing. Pt also c/o nausea and vomiting x 1 this morning.

## 2016-12-29 NOTE — Discharge Instructions (Signed)
Please take the medicine prescribed for a suspected lung infection and maybe even pericarditis.  If the chest pain doesn't improve despite the antibiotics, see the Cardiologist.  Please return to the ER if you have worsening chest pain, shortness of breath, pain radiating to your jaw, shoulder, or back, sweats or fainting. Otherwise see the Cardiologist or your primary care doctor as requested.

## 2017-01-02 ENCOUNTER — Ambulatory Visit (INDEPENDENT_AMBULATORY_CARE_PROVIDER_SITE_OTHER): Payer: Medicaid Other | Admitting: Family Medicine

## 2017-01-02 ENCOUNTER — Encounter: Payer: Self-pay | Admitting: Family Medicine

## 2017-01-02 VITALS — BP 128/84 | HR 72 | Temp 97.6°F | Resp 19 | Ht 62.0 in | Wt 187.1 lb

## 2017-01-02 DIAGNOSIS — R519 Headache, unspecified: Secondary | ICD-10-CM

## 2017-01-02 DIAGNOSIS — R071 Chest pain on breathing: Secondary | ICD-10-CM

## 2017-01-02 DIAGNOSIS — R51 Headache: Secondary | ICD-10-CM | POA: Diagnosis not present

## 2017-01-02 DIAGNOSIS — E559 Vitamin D deficiency, unspecified: Secondary | ICD-10-CM | POA: Insufficient documentation

## 2017-01-02 DIAGNOSIS — J189 Pneumonia, unspecified organism: Secondary | ICD-10-CM

## 2017-01-02 MED ORDER — BENZONATATE 100 MG PO CAPS: 100.0000 mg | ORAL_CAPSULE | Freq: Three times a day (TID) | ORAL | 0 refills | Status: DC | PRN

## 2017-01-02 NOTE — Progress Notes (Signed)
Chief Complaint  Patient presents with  . Follow-up    [facility] ER   ER notes reviewed Labs and EKG reviewed Chest x ray reviewed Diagnoses:  CAP and pericarditis taking azithromycin.  Last dose today Taking the colchicine daily. Chest pain is still present.  Worse with lying down and deep breath.  Substernal.  No dyspnea.  Still with  Cough. Has chills and sweats at night. Is eating and drinking well.  Compliant with medicine.  Is tired, has stayed at home.  Continues with daily headache.  Not improved on beta blocker and with normal BP.  Will refer neuro.  We tired in March, but patient says that doctor called her and stated they do not take Medicaid.   Patient Active Problem List   Diagnosis Date Noted  . Vitamin D deficiency 01/02/2017  . Essential hypertension 10/02/2016  . Tobacco abuse 10/02/2016  . Mental disability 10/02/2016  . Genital herpes 10/02/2016  . Chronic nonintractable headache 10/02/2016    Outpatient Encounter Prescriptions as of 01/02/2017  Medication Sig  . azithromycin (ZITHROMAX) 250 MG tablet Take 1 tablet (250 mg total) by mouth daily. Take first 2 tablets together, then 1 every day until finished.  . cholecalciferol (VITAMIN D) 1000 units tablet Take 2,000 Units by mouth daily.  . colchicine 0.6 MG tablet Take 1 tablet (0.6 mg total) by mouth daily.  Marland Kitchen ibuprofen (ADVIL,MOTRIN) 400 MG tablet Take 1 tablet (400 mg total) by mouth every 8 (eight) hours as needed.  . labetalol (NORMODYNE) 100 MG tablet Take 1 tablet (100 mg total) by mouth 2 (two) times daily.  . valACYclovir (VALTREX) 500 MG tablet Take 500 mg by mouth daily.   . benzonatate (TESSALON) 100 MG capsule Take 1 capsule (100 mg total) by mouth 3 (three) times daily as needed for cough.   No facility-administered encounter medications on file as of 01/02/2017.     No Known Allergies  Review of Systems  Constitutional: Positive for activity change, chills and fever. Negative for appetite  change and unexpected weight change.  HENT: Negative for congestion, dental problem, postnasal drip and rhinorrhea.   Eyes: Negative for redness and visual disturbance.  Respiratory: Positive for cough. Negative for shortness of breath.   Cardiovascular: Positive for chest pain. Negative for palpitations and leg swelling.  Gastrointestinal: Negative for constipation and diarrhea.  Genitourinary: Negative for difficulty urinating, frequency and menstrual problem.  Musculoskeletal: Negative for arthralgias and back pain.  Neurological: Positive for headaches. Negative for dizziness.       Chronic daily  Psychiatric/Behavioral: Positive for sleep disturbance. Negative for dysphoric mood. The patient is not nervous/anxious.        Hurts to lie down     BP 128/84 (BP Location: Right Arm, Patient Position: Sitting, Cuff Size: Normal)   Pulse 72   Temp 97.6 F (36.4 C) (Temporal)   Resp 19   Ht 5\' 2"  (1.575 m)   Wt 187 lb 1.3 oz (84.9 kg)   LMP 12/29/2016   SpO2 100%   BMI 34.22 kg/m   Physical Exam  Constitutional: She is oriented to person, place, and time. She appears well-developed and well-nourished. No distress.  Mildly ill, looks tired  HENT:  Head: Normocephalic and atraumatic.  Right Ear: External ear normal.  Left Ear: External ear normal.  Mouth/Throat: Oropharynx is clear and moist.  Eyes: Conjunctivae are normal. Pupils are equal, round, and reactive to light.  Neck: Normal range of motion. Neck supple. Thyromegaly  present.  Cardiovascular: Normal rate, regular rhythm and normal heart sounds.  Exam reveals no friction rub.   No murmur heard. Do not hear rub  Pulmonary/Chest: Effort normal and breath sounds normal. She has no rales.  Do not hear rales  Abdominal: Soft. Bowel sounds are normal. There is no tenderness.  Musculoskeletal: Normal range of motion. She exhibits no edema.  Lymphadenopathy:    She has no cervical adenopathy.  Neurological: She is alert and  oriented to person, place, and time.  Psychiatric: She has a normal mood and affect. Her behavior is normal.    ASSESSMENT/PLAN:  1. Chest pain on breathing Possible pericarditis - ECHOCARDIOGRAM COMPLETE; Future  2. Community acquired pneumonia, unspecified laterality Finishing antibiotic  3. Chronic daily headache - Ambulatory referral to Neurology   Patient Instructions  Drink plenty of water Finish the antibiotic Take the tessalon cough pill as needed Take the colchicine every day until you return I am ordering an echocardiogram  See me in 2 weeks ( after echo) Call sooner if worse  I have re ordered the neurology consult.  We will find a doctor who takes medicaid   Eustace MooreYvonne Sue Lawayne Hartig, MD

## 2017-01-02 NOTE — Patient Instructions (Signed)
Drink plenty of water Finish the antibiotic Take the tessalon cough pill as needed Take the colchicine every day until you return I am ordering an echocardiogram  See me in 2 weeks ( after echo) Call sooner if worse  I have re ordered the neurology consult.  We will find a doctor who takes medicaid

## 2017-01-03 ENCOUNTER — Ambulatory Visit (HOSPITAL_COMMUNITY)
Admission: RE | Admit: 2017-01-03 | Discharge: 2017-01-03 | Disposition: A | Discharge status: Home / Self Care | Payer: Medicaid Other | Source: Ambulatory Visit | Attending: Family Medicine | Admitting: Family Medicine

## 2017-01-03 ENCOUNTER — Telehealth: Payer: Self-pay | Admitting: Family Medicine

## 2017-01-03 ENCOUNTER — Other Ambulatory Visit: Payer: Self-pay

## 2017-01-03 DIAGNOSIS — I10 Essential (primary) hypertension: Secondary | ICD-10-CM | POA: Insufficient documentation

## 2017-01-03 DIAGNOSIS — Z720 Tobacco use: Secondary | ICD-10-CM | POA: Diagnosis not present

## 2017-01-03 DIAGNOSIS — I340 Nonrheumatic mitral (valve) insufficiency: Secondary | ICD-10-CM | POA: Insufficient documentation

## 2017-01-03 DIAGNOSIS — R071 Chest pain on breathing: Secondary | ICD-10-CM | POA: Insufficient documentation

## 2017-01-03 MED ORDER — BENZONATATE 100 MG PO CAPS: 100.0000 mg | ORAL_CAPSULE | Freq: Three times a day (TID) | ORAL | 0 refills | Status: DC | PRN

## 2017-01-03 NOTE — Progress Notes (Signed)
*  PRELIMINARY RESULTS* Echocardiogram 2D Echocardiogram has been performed.  Carol Edwards, Carol Edwards 01/03/2017, 1:50 PM

## 2017-01-03 NOTE — Telephone Encounter (Signed)
Patient calling because [facility] will not fill the Rx for her cough because you have medicaid and is not covered.  Is there something else that she can be prescribed that will be covered.

## 2017-01-03 NOTE — Telephone Encounter (Signed)
Pt aware.

## 2017-01-03 NOTE — Telephone Encounter (Signed)
[facility]

## 2017-01-16 ENCOUNTER — Encounter: Payer: Self-pay | Admitting: Family Medicine

## 2017-01-16 ENCOUNTER — Ambulatory Visit (INDEPENDENT_AMBULATORY_CARE_PROVIDER_SITE_OTHER): Payer: Medicaid Other | Admitting: Family Medicine

## 2017-01-16 VITALS — BP 112/76 | HR 72 | Temp 97.9°F | Resp 16 | Ht 62.0 in | Wt 187.0 lb

## 2017-01-16 DIAGNOSIS — M542 Cervicalgia: Secondary | ICD-10-CM

## 2017-01-16 DIAGNOSIS — I10 Essential (primary) hypertension: Secondary | ICD-10-CM

## 2017-01-16 DIAGNOSIS — R071 Chest pain on breathing: Secondary | ICD-10-CM

## 2017-01-16 DIAGNOSIS — R51 Headache: Secondary | ICD-10-CM

## 2017-01-16 DIAGNOSIS — R519 Headache, unspecified: Secondary | ICD-10-CM

## 2017-01-16 MED ORDER — LABETALOL HCL 100 MG PO TABS: 100.0000 mg | ORAL_TABLET | Freq: Two times a day (BID) | ORAL | 11 refills | Status: DC

## 2017-01-16 MED ORDER — CYCLOBENZAPRINE HCL 10 MG PO TABS: 10.0000 mg | ORAL_TABLET | Freq: Every day | ORAL | 0 refills | Status: DC

## 2017-01-16 MED ORDER — NAPROXEN 500 MG PO TABS: 500.0000 mg | ORAL_TABLET | Freq: Two times a day (BID) | ORAL | 0 refills | Status: DC

## 2017-01-16 NOTE — Patient Instructions (Addendum)
Take the colchicine for 6 months Your blood pressure is good Take the naproxen 500 twice a day with food Stop the ibuprofen This is for neck pain Take the flexeril at night This is a muscle relaxer  I have referred you to neurology.  We will call about that appointment

## 2017-01-16 NOTE — Progress Notes (Signed)
Chief Complaint  Patient presents with  . Follow-up    2 week s/p echo  Here for follow up after having an echocardiogram.  It was normal, or as expected, mild LVH.  No pericardial effusion.   Patient feels better.  Still with some persistent cough.  Chest pain much better.  Sleeping better.  No sputum.  NO fever. She complains still of chronic daily headache.  Will check on her neurology referral.  No aura or migraine.  Just headache "behind my eyes" , Ache. She complains today of neck pain and stiffness.  No injury. No new activity.  No symptoms of numbness or weakness in arms.  No prior neck problems. Is compliant with her BP medicine.  BP well controlled today   Patient Active Problem List   Diagnosis Date Noted  . Vitamin D deficiency 01/02/2017  . Essential hypertension 10/02/2016  . Tobacco abuse 10/02/2016  . Mental disability 10/02/2016  . Genital herpes 10/02/2016  . Chronic nonintractable headache 10/02/2016    Outpatient Encounter Prescriptions as of 01/16/2017  Medication Sig  . cholecalciferol (VITAMIN D) 1000 units tablet Take 2,000 Units by mouth daily.  . colchicine 0.6 MG tablet Take 1 tablet (0.6 mg total) by mouth daily.  Marland Kitchen. labetalol (NORMODYNE) 100 MG tablet Take 1 tablet (100 mg total) by mouth 2 (two) times daily.  . valACYclovir (VALTREX) 500 MG tablet Take 500 mg by mouth daily.   . cyclobenzaprine (FLEXERIL) 10 MG tablet Take 1 tablet (10 mg total) by mouth at bedtime. For muscle spasm  . naproxen (NAPROSYN) 500 MG tablet Take 1 tablet (500 mg total) by mouth 2 (two) times daily with a meal.   No facility-administered encounter medications on file as of 01/16/2017.     No Known Allergies  Review of Systems  Constitutional: Negative for activity change, appetite change and fever.  HENT: Negative for congestion, dental problem and postnasal drip.   Eyes: Negative for photophobia and visual disturbance.  Respiratory: Negative for cough and shortness  of breath.   Cardiovascular: Positive for chest pain. Negative for palpitations and leg swelling.       Improved  Gastrointestinal: Negative for diarrhea, nausea and vomiting.  Genitourinary: Negative for difficulty urinating.  Musculoskeletal: Positive for neck pain and neck stiffness. Negative for arthralgias, back pain and gait problem.  Neurological: Positive for headaches. Negative for weakness and numbness.  Hematological: Negative for adenopathy. Does not bruise/bleed easily.  Psychiatric/Behavioral: Negative for dysphoric mood. The patient is not nervous/anxious.     BP 112/76 (BP Location: Right Arm, Patient Position: Sitting, Cuff Size: Large)   Pulse 72   Temp 97.9 F (36.6 C) (Temporal)   Resp 16   Ht 5\' 2"  (1.575 m)   Wt 187 lb (84.8 kg)   LMP 12/29/2016   SpO2 100%   BMI 34.20 kg/m   Physical Exam  Constitutional: She is oriented to person, place, and time. She appears well-developed and well-nourished. No distress.  Mildly ill, looks tired  HENT:  Head: Normocephalic and atraumatic.  Mouth/Throat: Oropharynx is clear and moist.  Eyes: Conjunctivae are normal. Pupils are equal, round, and reactive to light.  Neck: Normal range of motion. Neck supple.  Tenderness to palpation upper trapezius muscles , lower cervical region  Cardiovascular: Normal rate, regular rhythm and normal heart sounds.  Exam reveals no friction rub.   No murmur heard. Do not hear rub  Pulmonary/Chest: Effort normal and breath sounds normal. She has no rales.  Do not hear rales  Abdominal: Soft. Bowel sounds are normal. There is no tenderness.  Musculoskeletal: Normal range of motion. She exhibits no edema.  Lymphadenopathy:    She has no cervical adenopathy.  Neurological: She is alert and oriented to person, place, and time. She displays normal reflexes. No sensory deficit.  Psychiatric: She has a normal mood and affect. Her behavior is normal.    ASSESSMENT/PLAN:  1. Chronic  nonintractable headache, unspecified headache type Refer to neurology  2. Neck pain Muscular.  treat with naproxen and flexeril HS.  Stretching recommended/demonstrated  3. Essential hypertension Good control  4. Chest pain on breathing Pericarditis by ER Dx, poss chest wall pain.  Slowly improving.  Will continue colchicine 6 mo.   Patient Instructions  Take the colchicine for 6 months Your blood pressure is good Take the naproxen 500 twice a day with food Stop the ibuprofen This is for neck pain Take the flexeril at night This is a muscle relaxer  I have referred you to neurology.  We will call about that appointment   Eustace Moore, MD

## 2017-02-19 ENCOUNTER — Ambulatory Visit: Payer: Medicaid Other | Admitting: Family Medicine

## 2017-03-26 ENCOUNTER — Encounter (HOSPITAL_COMMUNITY): Payer: Self-pay | Admitting: Emergency Medicine

## 2017-03-26 ENCOUNTER — Emergency Department (HOSPITAL_COMMUNITY)
Admission: EM | Admit: 2017-03-26 | Discharge: 2017-03-26 | Disposition: A | Discharge status: Home / Self Care | Payer: Medicaid Other | Attending: Emergency Medicine | Admitting: Emergency Medicine

## 2017-03-26 ENCOUNTER — Emergency Department (HOSPITAL_COMMUNITY): Payer: Medicaid Other

## 2017-03-26 DIAGNOSIS — R109 Unspecified abdominal pain: Secondary | ICD-10-CM

## 2017-03-26 DIAGNOSIS — O9989 Other specified diseases and conditions complicating pregnancy, childbirth and the puerperium: Secondary | ICD-10-CM | POA: Diagnosis not present

## 2017-03-26 DIAGNOSIS — O26899 Other specified pregnancy related conditions, unspecified trimester: Secondary | ICD-10-CM

## 2017-03-26 DIAGNOSIS — Z87891 Personal history of nicotine dependence: Secondary | ICD-10-CM | POA: Insufficient documentation

## 2017-03-26 DIAGNOSIS — O210 Mild hyperemesis gravidarum: Secondary | ICD-10-CM

## 2017-03-26 DIAGNOSIS — Z79899 Other long term (current) drug therapy: Secondary | ICD-10-CM | POA: Insufficient documentation

## 2017-03-26 DIAGNOSIS — I10 Essential (primary) hypertension: Secondary | ICD-10-CM | POA: Insufficient documentation

## 2017-03-26 DIAGNOSIS — R1084 Generalized abdominal pain: Secondary | ICD-10-CM | POA: Diagnosis present

## 2017-03-26 DIAGNOSIS — O211 Hyperemesis gravidarum with metabolic disturbance: Secondary | ICD-10-CM | POA: Diagnosis not present

## 2017-03-26 HISTORY — DX: Migraine, unspecified, not intractable, without status migrainosus: G43.909

## 2017-03-26 LAB — COMPREHENSIVE METABOLIC PANEL
ALT: 20 U/L (ref 14–54)
AST: 22 U/L (ref 15–41)
Albumin: 4.4 g/dL (ref 3.5–5.0)
Alkaline Phosphatase: 56 U/L (ref 38–126)
Anion gap: 10 (ref 5–15)
BUN: 9 mg/dL (ref 6–20)
CHLORIDE: 108 mmol/L (ref 101–111)
CO2: 23 mmol/L (ref 22–32)
CREATININE: 0.82 mg/dL (ref 0.44–1.00)
Calcium: 9.6 mg/dL (ref 8.9–10.3)
Glucose, Bld: 135 mg/dL — ABNORMAL HIGH (ref 65–99)
POTASSIUM: 3.5 mmol/L (ref 3.5–5.1)
Sodium: 141 mmol/L (ref 135–145)
Total Bilirubin: 0.8 mg/dL (ref 0.3–1.2)
Total Protein: 9 g/dL — ABNORMAL HIGH (ref 6.5–8.1)

## 2017-03-26 LAB — CBC
HCT: 39.2 % (ref 36.0–46.0)
Hemoglobin: 13.1 g/dL (ref 12.0–15.0)
MCH: 30.2 pg (ref 26.0–34.0)
MCHC: 33.4 g/dL (ref 30.0–36.0)
MCV: 90.3 fL (ref 78.0–100.0)
Platelets: 447 10*3/uL — ABNORMAL HIGH (ref 150–400)
RBC: 4.34 MIL/uL (ref 3.87–5.11)
RDW: 13 % (ref 11.5–15.5)
WBC: 10.7 10*3/uL — ABNORMAL HIGH (ref 4.0–10.5)

## 2017-03-26 LAB — URINALYSIS, ROUTINE W REFLEX MICROSCOPIC
BILIRUBIN URINE: NEGATIVE
Glucose, UA: NEGATIVE mg/dL
Hgb urine dipstick: NEGATIVE
KETONES UR: 5 mg/dL — AB
Nitrite: NEGATIVE
Protein, ur: 100 mg/dL — AB
Specific Gravity, Urine: 1.026 (ref 1.005–1.030)
pH: 5 (ref 5.0–8.0)

## 2017-03-26 LAB — LIPASE, BLOOD: LIPASE: 18 U/L (ref 11–51)

## 2017-03-26 LAB — PREGNANCY, URINE: PREG TEST UR: POSITIVE — AB

## 2017-03-26 MED ORDER — PROMETHAZINE HCL 25 MG PO TABS: ORAL_TABLET | ORAL | 0 refills | Status: DC

## 2017-03-26 MED ORDER — SODIUM CHLORIDE 0.9 % IV BOLUS (SEPSIS)
1000.0000 mL | Freq: Once | INTRAVENOUS | Status: AC
  Administered 2017-03-26: 1000 mL via INTRAVENOUS

## 2017-03-26 MED ORDER — ONDANSETRON HCL 4 MG/2ML IJ SOLN
4.0000 mg | Freq: Once | INTRAMUSCULAR | Status: AC
  Administered 2017-03-26: 4 mg via INTRAVENOUS
  Filled 2017-03-26: qty 2

## 2017-03-26 MED ORDER — ONDANSETRON 4 MG PO TBDP
4.0000 mg | ORAL_TABLET | Freq: Once | ORAL | Status: AC
  Administered 2017-03-26: 4 mg via ORAL
  Filled 2017-03-26: qty 1

## 2017-03-26 MED ORDER — PROMETHAZINE HCL 25 MG/ML IJ SOLN
12.5000 mg | Freq: Once | INTRAMUSCULAR | Status: AC
  Administered 2017-03-26: 12.5 mg via INTRAVENOUS
  Filled 2017-03-26: qty 1
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
## 2017-03-26 NOTE — ED Notes (Signed)
Patient continues to vomit in waiting area.

## 2017-03-26 NOTE — ED Triage Notes (Addendum)
Patient complaining of right sided abdominal pain and vomiting x 3 days. Patient currently vomiting in triage. Patient denies dysuria.

## 2017-03-26 NOTE — ED Notes (Signed)
Pt given ice chips and encouraged to wait for zofran to take effect before sucking on ice; pt verbalized understanding

## 2017-03-26 NOTE — ED Notes (Signed)
Pt still vomiting after eating some ice chips, Dr. Estell HarpinZammit notified and orders given for phenergan

## 2017-03-26 NOTE — Discharge Instructions (Signed)
Follow up with your md in 1-2 week.  Drink plenty of fluids

## 2017-03-28 ENCOUNTER — Encounter (HOSPITAL_COMMUNITY): Payer: Self-pay | Admitting: *Deleted

## 2017-03-28 ENCOUNTER — Other Ambulatory Visit: Payer: Self-pay

## 2017-03-28 ENCOUNTER — Inpatient Hospital Stay (HOSPITAL_COMMUNITY)
Admission: EM | Admit: 2017-03-28 | Discharge: 2017-04-04 | DRG: 781 | Disposition: A | Discharge status: Home / Self Care | Payer: Medicaid Other | Attending: Obstetrics and Gynecology | Admitting: Obstetrics and Gynecology

## 2017-03-28 ENCOUNTER — Telehealth: Payer: Self-pay | Admitting: Family Medicine

## 2017-03-28 DIAGNOSIS — Z87891 Personal history of nicotine dependence: Secondary | ICD-10-CM | POA: Diagnosis not present

## 2017-03-28 DIAGNOSIS — O210 Mild hyperemesis gravidarum: Principal | ICD-10-CM

## 2017-03-28 DIAGNOSIS — O9989 Other specified diseases and conditions complicating pregnancy, childbirth and the puerperium: Secondary | ICD-10-CM | POA: Diagnosis present

## 2017-03-28 DIAGNOSIS — O162 Unspecified maternal hypertension, second trimester: Secondary | ICD-10-CM | POA: Diagnosis present

## 2017-03-28 DIAGNOSIS — O211 Hyperemesis gravidarum with metabolic disturbance: Secondary | ICD-10-CM | POA: Diagnosis not present

## 2017-03-28 DIAGNOSIS — Z3A01 Less than 8 weeks gestation of pregnancy: Secondary | ICD-10-CM | POA: Diagnosis not present

## 2017-03-28 DIAGNOSIS — Z6836 Body mass index (BMI) 36.0-36.9, adult: Secondary | ICD-10-CM | POA: Diagnosis not present

## 2017-03-28 DIAGNOSIS — O99282 Endocrine, nutritional and metabolic diseases complicating pregnancy, second trimester: Secondary | ICD-10-CM | POA: Diagnosis present

## 2017-03-28 DIAGNOSIS — E860 Dehydration: Secondary | ICD-10-CM | POA: Diagnosis present

## 2017-03-28 DIAGNOSIS — E663 Overweight: Secondary | ICD-10-CM | POA: Diagnosis present

## 2017-03-28 LAB — CBC WITH DIFFERENTIAL/PLATELET
BASOS ABS: 0 10*3/uL (ref 0.0–0.1)
BASOS PCT: 0 %
Eosinophils Absolute: 0 10*3/uL (ref 0.0–0.7)
Eosinophils Relative: 0 %
HEMATOCRIT: 38.6 % (ref 36.0–46.0)
HEMOGLOBIN: 13.4 g/dL (ref 12.0–15.0)
Lymphocytes Relative: 19 %
Lymphs Abs: 2.2 10*3/uL (ref 0.7–4.0)
MCH: 30.9 pg (ref 26.0–34.0)
MCHC: 34.7 g/dL (ref 30.0–36.0)
MCV: 89.1 fL (ref 78.0–100.0)
Monocytes Absolute: 0.8 10*3/uL (ref 0.1–1.0)
Monocytes Relative: 7 %
NEUTROS ABS: 8.7 10*3/uL — AB (ref 1.7–7.7)
NEUTROS PCT: 74 %
Platelets: 425 10*3/uL — ABNORMAL HIGH (ref 150–400)
RBC: 4.33 MIL/uL (ref 3.87–5.11)
RDW: 12.9 % (ref 11.5–15.5)
WBC: 11.8 10*3/uL — ABNORMAL HIGH (ref 4.0–10.5)

## 2017-03-28 LAB — COMPREHENSIVE METABOLIC PANEL
ALT: 92 U/L — ABNORMAL HIGH (ref 14–54)
ANION GAP: 14 (ref 5–15)
AST: 105 U/L — ABNORMAL HIGH (ref 15–41)
Albumin: 4.4 g/dL (ref 3.5–5.0)
Alkaline Phosphatase: 52 U/L (ref 38–126)
BILIRUBIN TOTAL: 1.2 mg/dL (ref 0.3–1.2)
BUN: 8 mg/dL (ref 6–20)
CO2: 20 mmol/L — ABNORMAL LOW (ref 22–32)
Calcium: 10 mg/dL (ref 8.9–10.3)
Chloride: 106 mmol/L (ref 101–111)
Creatinine, Ser: 0.79 mg/dL (ref 0.44–1.00)
GFR calc Af Amer: >60 mL/min (ref >60)
Glucose, Bld: 124 mg/dL — ABNORMAL HIGH (ref 65–99)
POTASSIUM: 3.3 mmol/L — AB (ref 3.5–5.1)
Sodium: 140 mmol/L (ref 135–145)
TOTAL PROTEIN: 9.1 g/dL — AB (ref 6.5–8.1)

## 2017-03-28 LAB — TSH: TSH: 1.228 u[IU]/mL (ref 0.350–4.500)

## 2017-03-28 LAB — LIPASE, BLOOD: LIPASE: 18 U/L (ref 11–51)

## 2017-03-28 MED ORDER — ONDANSETRON HCL 4 MG/2ML IJ SOLN
4.0000 mg | Freq: Once | INTRAMUSCULAR | Status: AC
  Administered 2017-03-28: 4 mg via INTRAVENOUS

## 2017-03-28 MED ORDER — CALCIUM CARBONATE ANTACID 500 MG PO CHEW: 2.0000 | CHEWABLE_TABLET | ORAL | Status: DC | PRN

## 2017-03-28 MED ORDER — LABETALOL HCL 200 MG PO TABS
100.0000 mg | ORAL_TABLET | Freq: Two times a day (BID) | ORAL | Status: DC
  Administered 2017-03-28 – 2017-03-31 (×7): 100 mg via ORAL
  Filled 2017-03-28 (×7): qty 1

## 2017-03-28 MED ORDER — PRENATAL MULTIVITAMIN CH
1.0000 | ORAL_TABLET | Freq: Every day | ORAL | Status: DC
  Administered 2017-03-29 – 2017-04-02 (×3): 1 via ORAL
  Filled 2017-03-28 (×9): qty 1

## 2017-03-28 MED ORDER — ZOLPIDEM TARTRATE 5 MG PO TABS
5.0000 mg | ORAL_TABLET | Freq: Every evening | ORAL | Status: DC | PRN
  Administered 2017-04-02: 5 mg via ORAL
  Filled 2017-03-28: qty 1

## 2017-03-28 MED ORDER — SODIUM CHLORIDE 0.9 % IV BOLUS (SEPSIS)
1000.0000 mL | Freq: Once | INTRAVENOUS | Status: AC
  Administered 2017-03-28: 1000 mL via INTRAVENOUS

## 2017-03-28 MED ORDER — KCL IN DEXTROSE-NACL 20-5-0.45 MEQ/L-%-% IV SOLN
INTRAVENOUS | Status: DC
  Administered 2017-03-28 – 2017-04-03 (×20): via INTRAVENOUS
  Filled 2017-03-28 (×4): qty 1000

## 2017-03-28 MED ORDER — PROMETHAZINE HCL 25 MG/ML IJ SOLN
12.5000 mg | Freq: Once | INTRAMUSCULAR | Status: AC
  Administered 2017-03-28: 12.5 mg via INTRAVENOUS
  Filled 2017-03-28: qty 1

## 2017-03-28 MED ORDER — PROMETHAZINE HCL 25 MG/ML IJ SOLN
12.5000 mg | INTRAMUSCULAR | Status: DC | PRN
  Administered 2017-03-28 – 2017-03-31 (×18): 12.5 mg via INTRAVENOUS
  Filled 2017-03-28 (×19): qty 1

## 2017-03-28 MED ORDER — DOCUSATE SODIUM 100 MG PO CAPS
100.0000 mg | ORAL_CAPSULE | Freq: Every day | ORAL | Status: DC
  Administered 2017-03-29 – 2017-04-03 (×3): 100 mg via ORAL
  Filled 2017-03-28 (×7): qty 1

## 2017-03-28 MED ORDER — ACETAMINOPHEN 325 MG PO TABS: 650.0000 mg | ORAL_TABLET | ORAL | Status: DC | PRN

## 2017-03-28 MED ORDER — POTASSIUM CHLORIDE 2 MEQ/ML IV SOLN: INTRAVENOUS | Status: DC

## 2017-03-28 MED ORDER — SODIUM CHLORIDE 0.9 % IV BOLUS (SEPSIS)
2000.0000 mL | Freq: Once | INTRAVENOUS | Status: AC
  Administered 2017-03-28: 2000 mL via INTRAVENOUS

## 2017-03-28 NOTE — ED Notes (Signed)
ED Provider at bedside. 
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
## 2017-03-28 NOTE — Telephone Encounter (Signed)
Patient called in to office, states she is very sick, on a lot of medications and she just found out she was pregnant. Wanted an appt with Dr Delton SeeNelson.  Spoke with Dr Delton SeeNelson, she states that patient needs to be seen by OB. She advised patient remain on labetalol but not to take anything else until speaking with OB.  Advised patient of Dr Lindaann SloughNelson's instructions. She states that she has not been able to get in touch with Dr Donzetta [place]Galloway in [place]. I advised patient I would call and try to get her an appt.  [facility] [place], Dr. Vashti HeyGalloway's office. I explained the situation. The earliest available was Aug 28 at 1:30. We scheduled patient that day but I also left a message on the nurse line explaining the situation to see if patient could be seen any sooner. Waiting on response.

## 2017-03-28 NOTE — ED Triage Notes (Addendum)
Pt c/o lower abdominal pain, vomiting and diarrhea that started last night. Pt is [redacted] weeks pregnant, hasn't seen OB yet. Pt reports she started throwing up blood last night. Denies vaginal discharge.

## 2017-03-29 DIAGNOSIS — O211 Hyperemesis gravidarum with metabolic disturbance: Secondary | ICD-10-CM

## 2017-03-29 LAB — RAPID URINE DRUG SCREEN, HOSP PERFORMED
Amphetamines: NOT DETECTED
BENZODIAZEPINES: NOT DETECTED
Barbiturates: NOT DETECTED
COCAINE: NOT DETECTED
Opiates: NOT DETECTED
Tetrahydrocannabinol: POSITIVE — AB

## 2017-03-29 LAB — COMPREHENSIVE METABOLIC PANEL
ALBUMIN: 2.8 g/dL — AB (ref 3.5–5.0)
ALK PHOS: 35 U/L — AB (ref 38–126)
ALT: 65 U/L — ABNORMAL HIGH (ref 14–54)
ANION GAP: 5 (ref 5–15)
AST: 56 U/L — ABNORMAL HIGH (ref 15–41)
BILIRUBIN TOTAL: 0.8 mg/dL (ref 0.3–1.2)
BUN: <5 mg/dL — ABNORMAL LOW (ref 6–20)
CALCIUM: 7.8 mg/dL — AB (ref 8.9–10.3)
CO2: 21 mmol/L — ABNORMAL LOW (ref 22–32)
Chloride: 110 mmol/L (ref 101–111)
Creatinine, Ser: 0.65 mg/dL (ref 0.44–1.00)
Glucose, Bld: 122 mg/dL — ABNORMAL HIGH (ref 65–99)
Potassium: 3.1 mmol/L — ABNORMAL LOW (ref 3.5–5.1)
SODIUM: 136 mmol/L (ref 135–145)
TOTAL PROTEIN: 6 g/dL — AB (ref 6.5–8.1)

## 2017-03-29 LAB — PROTEIN / CREATININE RATIO, URINE
Creatinine, Urine: 101.75 mg/dL
PROTEIN CREATININE RATIO: 0.06 mg/mg{creat} (ref 0.00–0.15)
TOTAL PROTEIN, URINE: 6 mg/dL

## 2017-03-29 MED ORDER — SCOPOLAMINE 1 MG/3DAYS TD PT72
1.0000 | MEDICATED_PATCH | TRANSDERMAL | Status: DC
  Administered 2017-03-29 – 2017-04-04 (×3): 1.5 mg via TRANSDERMAL
  Filled 2017-03-29 (×3): qty 1

## 2017-03-29 MED ORDER — GLYCOPYRROLATE 1 MG PO TABS
2.0000 mg | ORAL_TABLET | Freq: Three times a day (TID) | ORAL | Status: DC | PRN
  Administered 2017-03-29 – 2017-04-03 (×4): 2 mg via ORAL
  Filled 2017-03-29 (×5): qty 2

## 2017-03-29 NOTE — Progress Notes (Signed)
Subjective: Patient reports nausea. And increased spitting. She does desire to try foods this morning    Objective: I have reviewed patient's vital signs and intake and output.  GI: soft, non-tender; bowel sounds normal; no masses,  no organomegaly   Assessment/Plan: Pregnancy 6 weeks hyperemesis gravidarum plan admit Robinul reintroduce foods beginning this morning with bland diet  LOS: 1 day    Carol Edwards V 03/29/2017, 8:13 AM

## 2017-03-29 NOTE — Telephone Encounter (Signed)
Spoke with Venia CarbonMary Jane Perdue. She asked that we fax a list of patient's medications so they could at least advise patient on what is safe to take. Not sure if they can see her sooner. Will be in contact with patient

## 2017-03-29 NOTE — Progress Notes (Signed)
Initial Nutrition Assessment  DOCUMENTATION CODES:  Obese - pre-pregnancy    INTERVENTION: Soft diet   Snacks -TID between meals to be ordered by patient    NUTRITION DIAGNOSIS:   Inadequate oral intake related to  (hyperemesis gravidem ) as evidenced by meal completion < 25%.  GOAL:   Patient will meet greater than or equal to 90% of their needs   MONITOR:   PO intake, Labs, Supplement acceptance  REASON FOR ASSESSMENT:   Malnutrition Screening Tool    ASSESSMENT:   Patient is a 36 yo -6 weeks pregant and problem with nausea and vomiting.   Her weight is stable but she is not eating very much- today so far- only a few bites of fruit cocktail at lunch and drank 100% tea. Nutrition services will be obtaining breakfast orders today and will offer her a between meal snack to support better nutrition intake. Nursing is providing a handout with discharge that outlines nutrition recommendations with Hyperemesis Gravidem.    Nutrition-Focused physical exam deferred at this time.  Recent Labs Lab 03/26/17 1723 03/28/17 1655 03/29/17 0544  NA 141 140 136  K 3.5 3.3* 3.1*  CL 108 106 110  CO2 23 20* 21*  BUN 9 8 <5*  CREATININE 0.82 0.79 0.65  CALCIUM 9.6 10.0 7.8*  GLUCOSE 135* 124* 122*   Labs and Meds reviewed. Pre-natal vitamin, Colace.   IVF- D5/0.45 NS w/KCL @ 175 ml/hr (72 hrs).  Diet Order:  DIET SOFT Room service appropriate? Yes; Fluid consistency: Thin  Skin:  Reviewed, no issues  Last BM:  8/1  Height:   Ht Readings from Last 1 Encounters:  03/28/17 5' (1.524 m)    Weight:   Wt Readings from Last 1 Encounters:  03/28/17 188 lb 7.9 oz (85.5 kg)    Ideal Body Weight:  45.4 kg  BMI:  Body mass index is 36.81 kg/m.  Estimated Nutritional Needs:   Kcal:  2200   (no increased nutrition needs during 1st trimester)  Protein:  90-95 gr  Fluid:  >2.2 liters daily  EDUCATION NEEDS: Handout- Nutrition Therapy- Hyperemesis  Gravidem Recommendations:   Eat 5-6 small meals daily instead of 3 large meals.  Eat crackers before you get out of bed in the morning.  Starchy foods are usually tolerated such as: cereal, toast, bread, potatoes, pasta, rice, and pretzels.  Royann ShiversLynn Safal Halderman MS,RD,CSG,LDN Office: [phone] Pager: [phone]
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
## 2017-03-29 NOTE — Discharge Instructions (Signed)
Eating Plan for Hyperemesis Gravidarum °Hyperemesis gravidarum is a severe form of morning sickness. Because this condition causes severe nausea and vomiting, it can lead to dehydration, malnutrition, and weight loss. One way to lessen the symptoms of nausea and vomiting is to follow the eating plan for hyperemesis gravidarum. It is often used along with prescribed medicines to control your symptoms. °What can I do to relieve my symptoms? °Listen to your body. Everyone is different and has different preferences. Find what works best for you. Take any of the following actions that are helpful to you: °· Eat and drink slowly. °· Eat 5-6 small meals daily instead of 3 large meals. °· Eat crackers before you get out of bed in the morning. °· Try having a snack in the middle of the night. °· Starchy foods are usually tolerated well. Examples include cereal, toast, bread, potatoes, pasta, rice, and pretzels. °· Ginger may help with nausea. Add ¼ tsp ground ginger to hot tea or choose ginger tea. °· Try drinking 100% fruit juice or an electrolyte drink. An electrolyte drink contains sodium, potassium, and chloride. °· Continue to take your prenatal vitamins as told by your health care provider. If you are having trouble taking your prenatal vitamins, talk with your health care provider about different options. °· Include at least 1 serving of protein with your meals and snacks. Protein options include meats or poultry, beans, nuts, eggs, and yogurt. Try eating a protein-rich snack before bed. Examples of these snacks include cheese and crackers or half of a peanut butter or turkey sandwich. °· Consider eliminating foods that trigger your symptoms. These may include spicy foods, coffee, high-fat foods, very sweet foods, and acidic foods. °· Try meals that have more protein combined with bland, salty, lower-fat, and dry foods, such as nuts, seeds, pretzels, crackers, and cereal. °· Talk with your healthcare provider about  starting a supplement of vitamin B6. °· Have fluids that are cold, clear, and carbonated or sour. Examples include lemonade, ginger ale, lemon-lime soda, ice water, and sparkling water. °· Try lemon or mint tea. °· Try brushing your teeth or using a mouth rinse after meals. ° °What should I avoid to reduce my symptoms? °Avoiding some of the following things may help reduce your symptoms. °· Foods with strong smells. Try eating meals in well-ventilated areas that are free of odors. °· Drinking water or other beverages with meals. Try not to drink anything during the 30 minutes before and after your meals. °· Drinking more than 1 cup of fluid at a time. Sometimes using a straw helps. °· Fried or high-fat foods, such as butter and cream sauces. °· Spicy foods. °· Skipping meals as best as you can. Nausea can be more intense on an empty stomach. If you cannot tolerate food at that time, do not force it. Try sucking on ice chips or other frozen items, and make up for missed calories later. °· Lying down within 2 hours after eating. °· Environmental triggers. These may include smoky rooms, closed spaces, rooms with strong smells, warm or humid places, overly loud and noisy rooms, and rooms with motion or flickering lights. °· Quick and sudden changes in your movement. ° °This information is not intended to replace advice given to you by your health care provider. Make sure you discuss any questions you have with your health care provider. °Document Released: 06/11/2007 Document Revised: 04/12/2016 Document Reviewed: 03/14/2016 °Elsevier Interactive Patient Education © 2018 [employer] ° °

## 2017-03-31 LAB — CBC
HEMATOCRIT: 34.5 % — AB (ref 36.0–46.0)
HEMOGLOBIN: 11.9 g/dL — AB (ref 12.0–15.0)
MCH: 31.2 pg (ref 26.0–34.0)
MCHC: 34.5 g/dL (ref 30.0–36.0)
MCV: 90.6 fL (ref 78.0–100.0)
Platelets: 381 10*3/uL (ref 150–400)
RBC: 3.81 MIL/uL — ABNORMAL LOW (ref 3.87–5.11)
RDW: 13.1 % (ref 11.5–15.5)
WBC: 10.7 10*3/uL — ABNORMAL HIGH (ref 4.0–10.5)

## 2017-03-31 MED ORDER — ONDANSETRON HCL 4 MG/2ML IJ SOLN
4.0000 mg | INTRAMUSCULAR | Status: DC | PRN
  Administered 2017-04-01 – 2017-04-02 (×7): 4 mg via INTRAVENOUS
  Filled 2017-03-31 (×7): qty 2

## 2017-03-31 MED ORDER — LABETALOL HCL 5 MG/ML IV SOLN
10.0000 mg | Freq: Every day | INTRAVENOUS | Status: DC
  Administered 2017-03-31 – 2017-04-04 (×5): 10 mg via INTRAVENOUS
  Filled 2017-03-31 (×4): qty 4

## 2017-03-31 MED ORDER — PROMETHAZINE HCL 25 MG/ML IJ SOLN
25.0000 mg | INTRAMUSCULAR | Status: DC | PRN
  Administered 2017-04-01 – 2017-04-03 (×10): 25 mg via INTRAVENOUS
  Filled 2017-03-31 (×10): qty 1

## 2017-04-01 LAB — BASIC METABOLIC PANEL
ANION GAP: 7 (ref 5–15)
BUN: <5 mg/dL — ABNORMAL LOW (ref 6–20)
CALCIUM: 9.4 mg/dL (ref 8.9–10.3)
CO2: 24 mmol/L (ref 22–32)
CREATININE: 0.7 mg/dL (ref 0.44–1.00)
Chloride: 106 mmol/L (ref 101–111)
Glucose, Bld: 130 mg/dL — ABNORMAL HIGH (ref 65–99)
Potassium: 3.9 mmol/L (ref 3.5–5.1)
SODIUM: 137 mmol/L (ref 135–145)

## 2017-04-02 LAB — URINALYSIS, ROUTINE W REFLEX MICROSCOPIC
BILIRUBIN URINE: NEGATIVE
Glucose, UA: NEGATIVE mg/dL
HGB URINE DIPSTICK: NEGATIVE
KETONES UR: NEGATIVE mg/dL
Leukocytes, UA: NEGATIVE
Nitrite: NEGATIVE
PROTEIN: NEGATIVE mg/dL
Specific Gravity, Urine: 1.006 (ref 1.005–1.030)
pH: 7 (ref 5.0–8.0)

## 2017-04-02 LAB — COMPREHENSIVE METABOLIC PANEL
ALBUMIN: 3.6 g/dL (ref 3.5–5.0)
ALT: 100 U/L — AB (ref 14–54)
AST: 35 U/L (ref 15–41)
Alkaline Phosphatase: 50 U/L (ref 38–126)
Anion gap: 8 (ref 5–15)
CHLORIDE: 104 mmol/L (ref 101–111)
CO2: 23 mmol/L (ref 22–32)
CREATININE: 0.79 mg/dL (ref 0.44–1.00)
Calcium: 9.2 mg/dL (ref 8.9–10.3)
GFR calc Af Amer: >60 mL/min (ref >60)
GLUCOSE: 119 mg/dL — AB (ref 65–99)
POTASSIUM: 4.1 mmol/L (ref 3.5–5.1)
Sodium: 135 mmol/L (ref 135–145)
Total Bilirubin: 0.7 mg/dL (ref 0.3–1.2)
Total Protein: 7.4 g/dL (ref 6.5–8.1)

## 2017-04-02 LAB — CBC
HEMATOCRIT: 34.6 % — AB (ref 36.0–46.0)
Hemoglobin: 11.8 g/dL — ABNORMAL LOW (ref 12.0–15.0)
MCH: 31.2 pg (ref 26.0–34.0)
MCHC: 34.1 g/dL (ref 30.0–36.0)
MCV: 91.5 fL (ref 78.0–100.0)
PLATELETS: 360 10*3/uL (ref 150–400)
RBC: 3.78 MIL/uL — ABNORMAL LOW (ref 3.87–5.11)
RDW: 13.2 % (ref 11.5–15.5)
WBC: 12.2 10*3/uL — AB (ref 4.0–10.5)

## 2017-04-02 MED ORDER — METHYLPREDNISOLONE 4 MG PO TABS
16.0000 mg | ORAL_TABLET | Freq: Every day | ORAL | Status: DC
  Administered 2017-04-03: 16 mg via ORAL
  Filled 2017-04-02: qty 4
  Filled 2017-04-02: qty 1

## 2017-04-02 MED ORDER — METHYLPREDNISOLONE 4 MG PO TABS: 8.0000 mg | ORAL_TABLET | Freq: Every day | ORAL | Status: DC

## 2017-04-02 MED ORDER — METHYLPREDNISOLONE 4 MG PO TABS: 4.0000 mg | ORAL_TABLET | Freq: Every day | ORAL | Status: DC

## 2017-04-02 MED ORDER — METHYLPREDNISOLONE 4 MG PO TABS
16.0000 mg | ORAL_TABLET | Freq: Every day | ORAL | Status: DC
  Administered 2017-04-03: 16 mg via ORAL
  Filled 2017-04-02: qty 1
  Filled 2017-04-02: qty 4
  Filled 2017-04-02: qty 1

## 2017-04-02 MED ORDER — METHYLPREDNISOLONE SODIUM SUCC 125 MG IJ SOLR
48.0000 mg | Freq: Once | INTRAMUSCULAR | Status: AC
  Administered 2017-04-02: 48 mg via INTRAVENOUS
  Filled 2017-04-02: qty 2

## 2017-04-02 MED ORDER — ONDANSETRON HCL 4 MG/2ML IJ SOLN
4.0000 mg | INTRAMUSCULAR | Status: DC | PRN
  Administered 2017-04-02 – 2017-04-03 (×3): 4 mg via INTRAVENOUS
  Filled 2017-04-02 (×3): qty 2

## 2017-04-02 MED ORDER — METHYLPREDNISOLONE 4 MG PO TABS
16.0000 mg | ORAL_TABLET | Freq: Every day | ORAL | Status: DC
  Administered 2017-04-03 – 2017-04-04 (×2): 16 mg via ORAL
  Filled 2017-04-02: qty 4
  Filled 2017-04-02: qty 1
  Filled 2017-04-02: qty 4
  Filled 2017-04-02: qty 1

## 2017-04-02 MED ORDER — LIDOCAINE HCL (PF) 1 % IJ SOLN
30.0000 mL | Freq: Once | INTRAMUSCULAR | Status: DC
  Filled 2017-04-02: qty 30

## 2017-04-02 NOTE — Progress Notes (Signed)
FACULTY PRACTICE ANTEPARTUM(COMPREHENSIVE) NOTE  Delena Serverica N Sanderford is a 36 y.o. G3P1011 at 7 wk 2d by early ultrasound who is admitted for Hyperemesis gravidarum.  Pt has been admitted for rehydration and treatment with antiemetics but has continued to spit up and still has not tolerated po intake. Will begin steroid treatment for HG.  Length of Stay:  5  Days  Subjective: Pt drooling continuously.    Vitals:  Blood pressure (!) 151/92, pulse 99, temperature 97.9 F (36.6 C), temperature source Oral, resp. rate 18, height 5' (1.524 m), weight 188 lb 7.9 oz (85.5 kg), SpO2 96 %. Physical Examination:  General appearance - alert, well appearing, and in no distress, oriented to person, place, and time and overweight Heart - normal rate and regular rhythm Abdomen - soft, nontender, nondistended bowel sounds hypoactive.  Fundal Height:  size equals dates Extremities: extremities normal, atraumatic, no cyanosis or edema and Homans sign is negative, no sign of DVT with DTRs 2+ bilaterally Membranes:  Fetal Monitoring:  Not required, has documented FHT by u/s  Labs: CBC Latest Ref Rng & Units 03/31/2017 03/28/2017 03/26/2017  WBC 4.0 - 10.5 K/uL 10.7(H) 11.8(H) 10.7(H)  Hemoglobin 12.0 - 15.0 g/dL 11.9(L) 13.4 13.1  Hematocrit 36.0 - 46.0 % 34.5(L) 38.6 39.2  Platelets 150 - 400 K/uL 381 425(H) 447(H)     Imaging Studies:    not Currently EPIC will not allow sonographic studies to automatically populate into notes.  In the meantime, copy and paste results into note or free text.  Medications:  Scheduled . docusate sodium  100 mg Oral Daily  . labetalol  10 mg Intravenous Daily  . prenatal multivitamin  1 tablet Oral Q1200  . scopolamine  1 patch Transdermal Q72H   I have reviewed the patient's current medications.  ASSESSMENT: Patient Active Problem List   Diagnosis Date Noted  . Hyperemesis gravidarum 03/28/2017  . Hyperemesis gravidarum before end of [redacted] week gestation with dehydration  03/28/2017  . Vitamin D deficiency 01/02/2017  . Essential hypertension 10/02/2016  . Tobacco abuse 10/02/2016  . Mental disability 10/02/2016  . Genital herpes 10/02/2016  . Chronic nonintractable headache 10/02/2016    PLAN: Will add robinul, and begin steroid pack for hyperemesis.  Anticipate will be here 2-4 more days IV maintain at 75/hr Tilda BurrowFERGUSON,Yaneisy Wenz V 04/02/2017,1:02 PM    Patient ID: Delena Serverica N Shreiner, female   DOB: 08/31/1980, 36 y.o.   MRN: [id number]

## 2017-04-03 LAB — GLUCOSE, RANDOM: Glucose, Bld: 119 mg/dL — ABNORMAL HIGH (ref 65–99)

## 2017-04-03 MED ORDER — METOCLOPRAMIDE HCL 5 MG/ML IJ SOLN
10.0000 mg | Freq: Four times a day (QID) | INTRAMUSCULAR | Status: DC | PRN
  Administered 2017-04-03 – 2017-04-04 (×4): 10 mg via INTRAVENOUS
  Filled 2017-04-03 (×4): qty 2

## 2017-04-03 NOTE — Progress Notes (Addendum)
Subjective: Day 5 for Hyperemesis gravidarum at 7w 3 days. Pt has continued to have drooling, and Robinul has not been as helpful as usual. The patient refused the Robinul yesterday, perceiving it not to help.   Pt was begun on steroid Prednisone taper yesterday, and has yet to feel effect. Pt has not been placed on Reglan yet, will add that today.  Patient reports nausea and vomiting.  She had intended to continue this unplanned pregnancy, but if this nausea and vomiting cannot be improved, she may consider termination options.   Objective: I have reviewed patient's vital signs and intake and output. BP (!) 155/89 (BP Location: Left Arm)   Pulse 89   Temp 99.1 F (37.3 C) (Oral)   Resp 20   Ht 5' (1.524 m)   Wt 188 lb 7.9 oz (85.5 kg)   SpO2 100%   BMI 36.81 kg/m    General: alert, cooperative, distracted and fatigued Resp: clear to auscultation bilaterally GI: soft, non-tender; bowel sounds normal; no masses,  no organomegaly Vaginal Bleeding: none CBC Latest Ref Rng & Units 04/02/2017 03/31/2017 03/28/2017  WBC 4.0 - 10.5 K/uL 12.2(H) 10.7(H) 11.8(H)  Hemoglobin 12.0 - 15.0 g/dL 11.8(L) 11.9(L) 13.4  Hematocrit 36.0 - 46.0 % 34.6(L) 34.5(L) 38.6  Platelets 150 - 400 K/uL 360 381 425(H)    BMET    Component Value Date/Time   NA 135 04/02/2017 1340   K 4.1 04/02/2017 1340   CL 104 04/02/2017 1340   CO2 23 04/02/2017 1340   GLUCOSE 119 (H) 04/03/2017 0411   BUN <5 (L) 04/02/2017 1340   CREATININE 0.79 04/02/2017 1340   CREATININE 0.82 10/02/2016 1048   CALCIUM 9.2 04/02/2017 1340   GFRNONAA >60 04/02/2017 1340   GFRAA >60 04/02/2017 1340     Assessment/Plan: Hyperemesis at 7w 2 d Resolved dehydration, and resolved hypokalemia.  Continue IV at 75/hr while adding Reglan to current regimen of  Steroid taper, Scopolamine patch, IV Zofran.    LOS: 6 days    Autym Siess V 04/03/2017, 7:26 AM

## 2017-04-03 NOTE — Care Management Note (Signed)
Case Management Note  Patient Details  Name: Carol Edwards MRN: [id number] Date of Birth: 01/02/1981  If discussed at Long Length of Stay Meetings, dates discussed:  04/03/2017  Additional Comments:  Gerre Ranum, Chrystine OilerSharley Diane, RN 04/03/2017, 10:37 AM

## 2017-04-04 LAB — GLUCOSE, RANDOM: GLUCOSE: 126 mg/dL — AB (ref 65–99)

## 2017-04-04 MED ORDER — GLYCOPYRROLATE 1 MG PO TABS: 2.0000 mg | ORAL_TABLET | Freq: Three times a day (TID) | ORAL | 2 refills | Status: DC | PRN

## 2017-04-04 MED ORDER — LABETALOL HCL 100 MG PO TABS: 200.0000 mg | ORAL_TABLET | Freq: Two times a day (BID) | ORAL | 3 refills | Status: DC

## 2017-04-04 MED ORDER — ONDANSETRON 4 MG PO TBDP: 4.0000 mg | ORAL_TABLET | Freq: Four times a day (QID) | ORAL | 1 refills | Status: DC | PRN

## 2017-04-04 MED ORDER — METHYLPREDNISOLONE 4 MG PO TABS: 16.0000 mg | ORAL_TABLET | Freq: Two times a day (BID) | ORAL | 0 refills | Status: DC

## 2017-04-04 MED ORDER — METOCLOPRAMIDE HCL 10 MG PO TABS: 10.0000 mg | ORAL_TABLET | Freq: Three times a day (TID) | ORAL | 4 refills | Status: DC

## 2017-04-04 MED ORDER — METHYLPREDNISOLONE 16 MG PO TABS: 16.0000 mg | ORAL_TABLET | Freq: Two times a day (BID) | ORAL | 0 refills | Status: DC

## 2017-04-04 NOTE — Progress Notes (Signed)
Subjective: Patient reports nausea and no problems voiding.  She still drools somewhat but the Robinul is helping. She tolerated bland diet overnight is to go home and try to manage her hyperemesis with Robinul Reglan and the steroid Dosepak taper  Objective: I have reviewed patient's vital signs, intake and output and medications.  General: alert, cooperative and no distress GI: soft, non-tender; bowel sounds normal; no masses,  no organomegaly and Bowel sounds remain somewhat hypoactive   Assessment/Plan: Hyperemesis gravidarum, slightly improved we'll attempt outpatient care  LOS: 7 days    Carol Edwards V 04/04/2017, 8:29 AM

## 2017-04-04 NOTE — Discharge Summary (Signed)
    OB Discharge Summary     Patient Name: Carol Edwards DOB: 07/20/1981 MRN: [id number]  Date of admission: 03/28/2017 Delivering MD: This patient has no babies on file.  Date of discharge: 04/04/2017  Admitting diagnosis: Hyperemesis gravidarum [O21.0] Intrauterine pregnancy: Unknown     Secondary diagnosis:  Active Problems:   Hyperemesis gravidarum   Hyperemesis gravidarum before end of [redacted] week gestation with dehydration  Additional problems: Ptyalism     Discharge diagnosis: Pregnancy [redacted] weeks gestation, hyperemesis, improved                                                                                                  C  Hospital course:  Patient was admitted with dehydration and spitting and responded to IV fluids and scopolamine, and limited diet only minimally and was then added on steroid taper, Reglan also seem to help. Robinul did not.helped that much but was continued due to the spitting She finally tolerated bland diet on the final day of hospitalization was discharged home on the remainder of her steroid taper  Physical exam  Vitals:   04/03/17 0738 04/03/17 1357 04/03/17 2050 04/03/17 2110  BP:  139/88 (!) 149/89   Pulse:  98 98   Resp:  20 20   Temp:  99.3 F (37.4 C) 98.1 F (36.7 C)   TempSrc:  Oral Axillary   SpO2: 99% 100% 100% 99%  Weight:      Height:       General: alert and cooperative 87} Incision:  DVT Evaluation: No evidence of DVT seen on physical exam. Labs: Lab Results  Component Value Date   WBC 12.2 (H) 04/02/2017   HGB 11.8 (L) 04/02/2017   HCT 34.6 (L) 04/02/2017   MCV 91.5 04/02/2017   PLT 360 04/02/2017   CMP Latest Ref Rng & Units 04/04/2017  Glucose 65 - 99 mg/dL 409(W126(H)  BUN 6 - 20 mg/dL -  Creatinine 1.190.44 - 1.471.00 mg/dL -  Sodium 829135 - 562145 mmol/L -  Potassium 3.5 - 5.1 mmol/L -  Chloride 101 - 111 mmol/L -  CO2 22 - 32 mmol/L -  Calcium 8.9 - 10.3 mg/dL -  Total Protein 6.5 - 8.1 g/dL -  Total Bilirubin 0.3 - 1.2 mg/dL -   Alkaline Phos 38 - 126 U/L -  AST 15 - 41 U/L -  ALT 14 - 54 U/L -    Discharge instruction: per After Visit Summary and "Baby and Me Booklet".  After visit meds:  Reglan, Robinul, Zofran, and steroid taper  Diet: Bland diet  Activity: Advance as tolerated. Pelvic rest for 6 weeks.   Outpatient follow up:2 weeks Follow up Appt:No future appointments. Follow up Visit:No Follow-up on file.  Postpartum contraception: Not applicable     04/04/2017 Tilda BurrowFERGUSON,Kelby Lotspeich V, MD

## 2017-04-04 NOTE — Progress Notes (Signed)
Patient is to be discharged home and in stable condition. IV removed WNL. Patient given discharge instructions and verbalized understanding. Patient escorted out by staff via wheelchair.  Quita SkyeMorgan P Dishmon, RN

## 2017-04-11 ENCOUNTER — Encounter: Payer: Self-pay | Admitting: Obstetrics and Gynecology

## 2017-04-18 ENCOUNTER — Encounter: Payer: Self-pay | Admitting: Obstetrics and Gynecology

## 2017-04-18 ENCOUNTER — Encounter: Payer: Self-pay | Admitting: *Deleted

## 2017-05-01 ENCOUNTER — Ambulatory Visit: Payer: Self-pay | Admitting: Family Medicine

## 2017-05-04 ENCOUNTER — Ambulatory Visit: Payer: Self-pay | Admitting: Family Medicine

## 2017-06-07 IMAGING — DX DG CHEST 2V
2 series · 2 of 2 positions shown · non-contrast
Comparison: 07/05/2013, 04/22/2013 and earlier.

CLINICAL DATA: 35-year-old acute onset of mid chest pain radiating
to the right shoulder that began last night, worse with coughing and
deep inspiration. One episode of vomiting earlier this morning.
Current history of hypertension. Current smoker.

EXAM:
CHEST  2 VIEW

[chest pa]
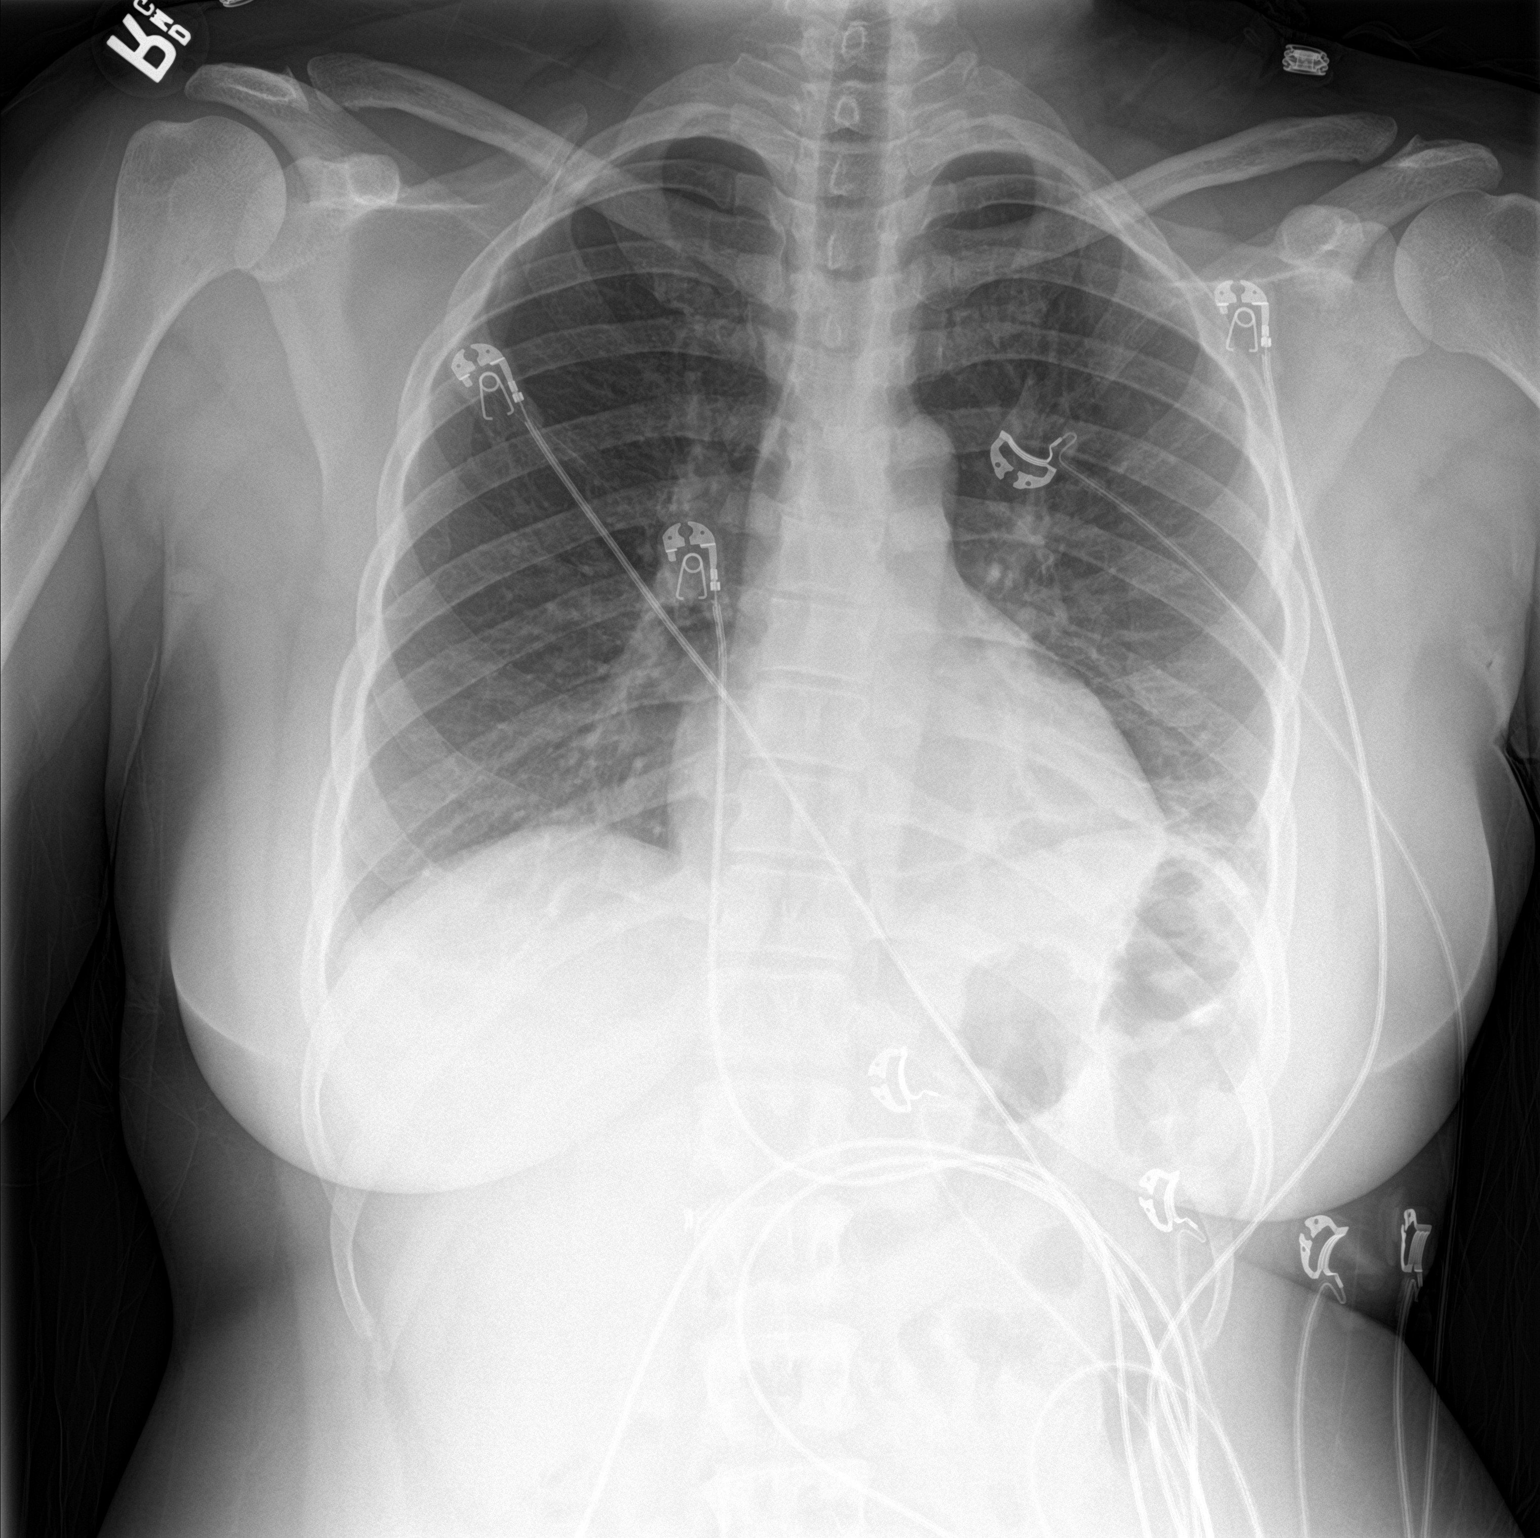

[chest lat]
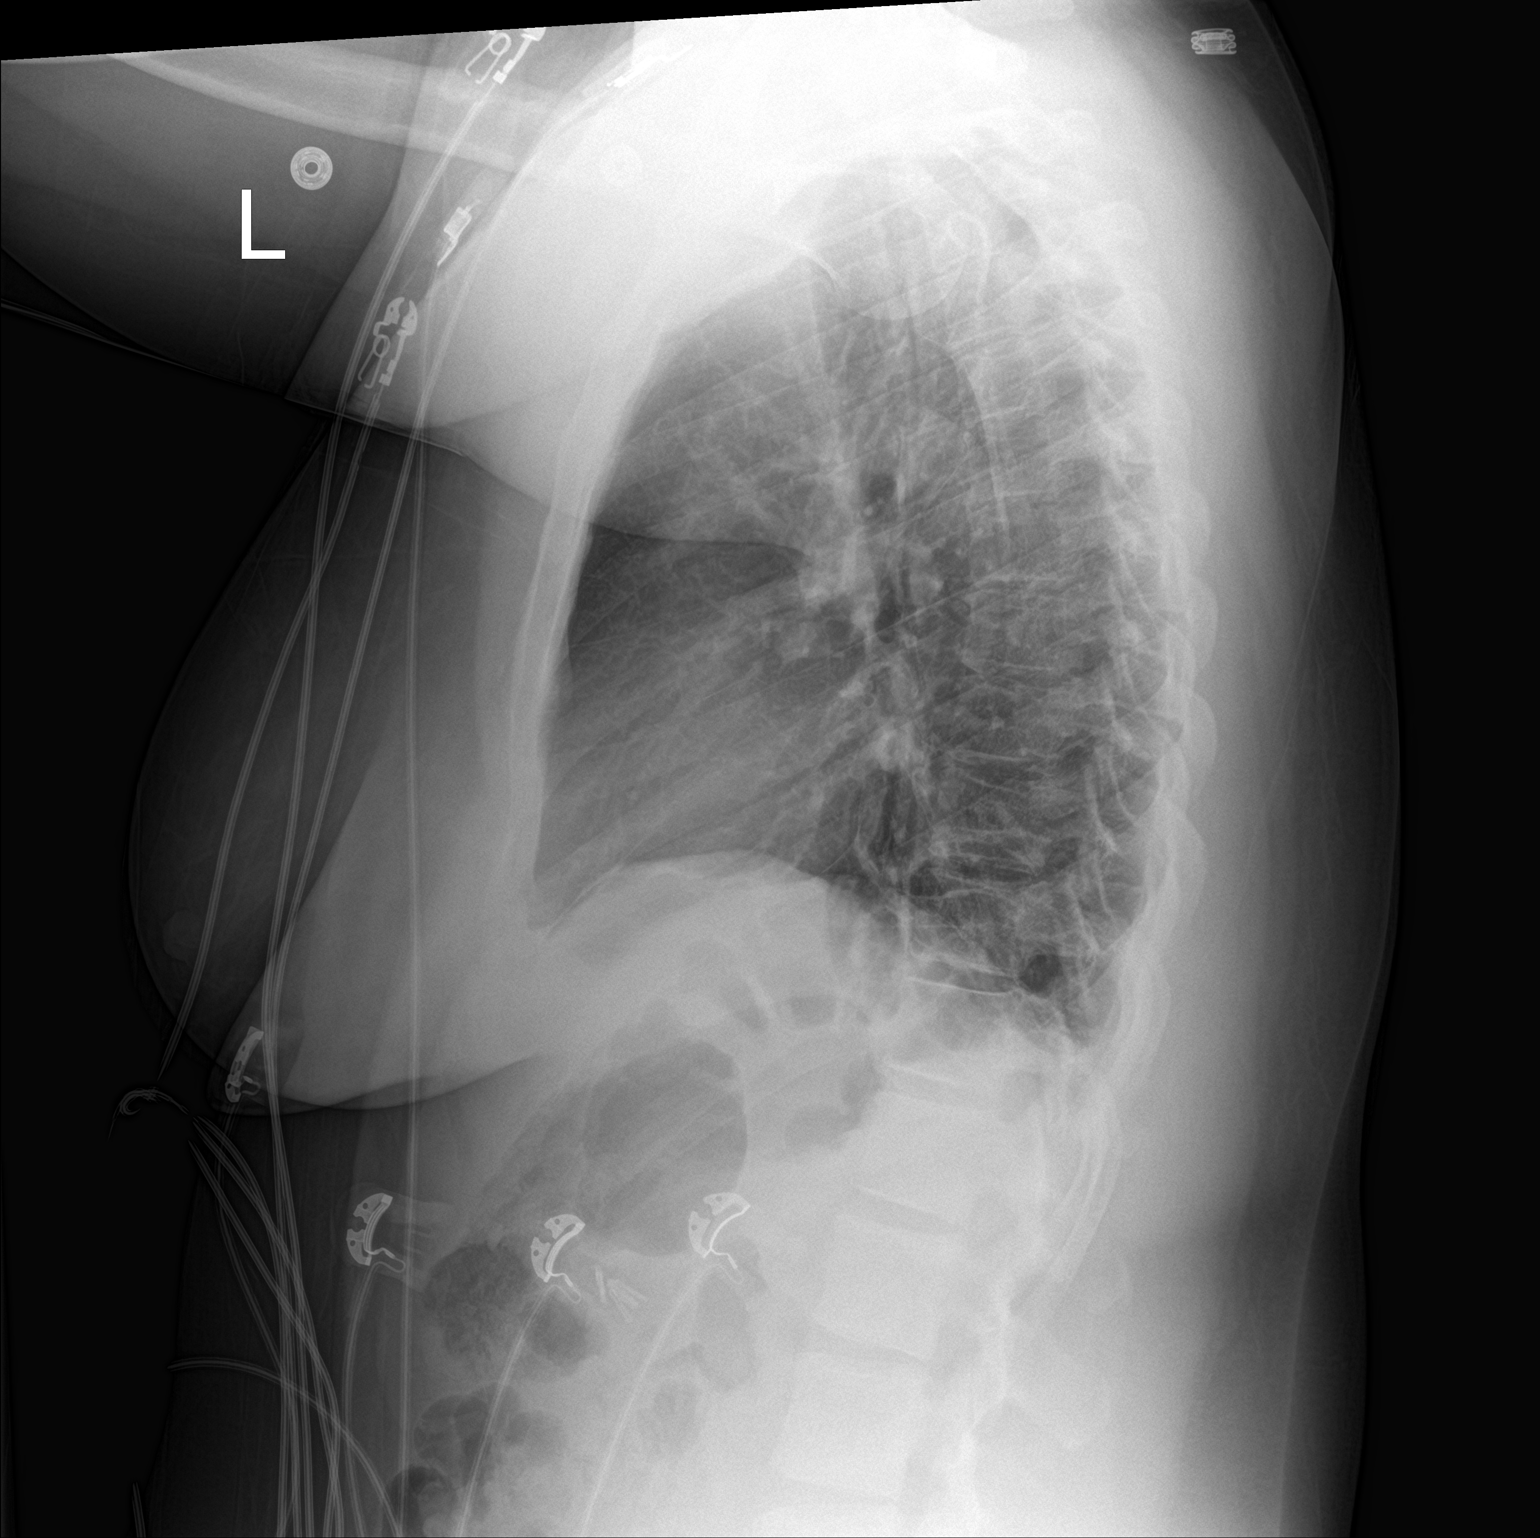

[2 of 2 positions shown; findings below may reference images not displayed]

FINDINGS: Cardiomediastinal silhouette unremarkable, unchanged. Streaky and
patchy airspace opacities in the left lower lobe. Lungs otherwise
clear. Small left pleural effusion. Visualized bony thorax intact.
IMPRESSION: Acute left lower lobe bronchopneumonia and associated small left
pleural effusion.

## 2017-10-08 ENCOUNTER — Encounter (HOSPITAL_COMMUNITY): Payer: Self-pay | Admitting: Emergency Medicine

## 2017-10-08 ENCOUNTER — Emergency Department (HOSPITAL_COMMUNITY)
Admission: EM | Admit: 2017-10-08 | Discharge: 2017-10-08 | Disposition: A | Discharge status: Home / Self Care | Payer: Medicaid Other | Attending: Emergency Medicine | Admitting: Emergency Medicine

## 2017-10-08 ENCOUNTER — Other Ambulatory Visit: Payer: Self-pay

## 2017-10-08 ENCOUNTER — Emergency Department (HOSPITAL_COMMUNITY): Payer: Medicaid Other

## 2017-10-08 DIAGNOSIS — I10 Essential (primary) hypertension: Secondary | ICD-10-CM | POA: Diagnosis not present

## 2017-10-08 DIAGNOSIS — F17210 Nicotine dependence, cigarettes, uncomplicated: Secondary | ICD-10-CM | POA: Diagnosis not present

## 2017-10-08 DIAGNOSIS — F329 Major depressive disorder, single episode, unspecified: Secondary | ICD-10-CM | POA: Insufficient documentation

## 2017-10-08 DIAGNOSIS — R69 Illness, unspecified: Secondary | ICD-10-CM

## 2017-10-08 DIAGNOSIS — J111 Influenza due to unidentified influenza virus with other respiratory manifestations: Secondary | ICD-10-CM | POA: Diagnosis not present

## 2017-10-08 DIAGNOSIS — M791 Myalgia, unspecified site: Secondary | ICD-10-CM | POA: Diagnosis present

## 2017-10-08 DIAGNOSIS — Z79899 Other long term (current) drug therapy: Secondary | ICD-10-CM | POA: Insufficient documentation

## 2017-10-08 DIAGNOSIS — Z9049 Acquired absence of other specified parts of digestive tract: Secondary | ICD-10-CM | POA: Insufficient documentation

## 2017-10-08 LAB — POC URINE PREG, ED
PREG TEST UR: NEGATIVE
Preg Test, Ur: NEGATIVE

## 2017-10-08 MED ORDER — ALBUTEROL SULFATE HFA 108 (90 BASE) MCG/ACT IN AERS: 2.0000 | INHALATION_SPRAY | RESPIRATORY_TRACT | 0 refills | Status: DC | PRN

## 2017-10-08 MED ORDER — IBUPROFEN 400 MG PO TABS: 400.0000 mg | ORAL_TABLET | Freq: Once | ORAL | Status: DC

## 2017-10-08 MED ORDER — ACETAMINOPHEN 325 MG PO TABS
650.0000 mg | ORAL_TABLET | Freq: Once | ORAL | Status: AC
  Administered 2017-10-08: 650 mg via ORAL
  Filled 2017-10-08: qty 2

## 2017-10-08 MED ORDER — OSELTAMIVIR PHOSPHATE 75 MG PO CAPS: 75.0000 mg | ORAL_CAPSULE | Freq: Two times a day (BID) | ORAL | 0 refills | Status: DC

## 2017-10-08 MED ORDER — BENZONATATE 100 MG PO CAPS: 100.0000 mg | ORAL_CAPSULE | Freq: Three times a day (TID) | ORAL | 0 refills | Status: DC | PRN

## 2017-10-08 MED ORDER — IPRATROPIUM-ALBUTEROL 0.5-2.5 (3) MG/3ML IN SOLN
3.0000 mL | Freq: Once | RESPIRATORY_TRACT | Status: AC
  Administered 2017-10-08: 3 mL via RESPIRATORY_TRACT
  Filled 2017-10-08: qty 3

## 2017-10-08 NOTE — Discharge Instructions (Signed)
Take over the counter tylenol and ibuprofen, as directed on packaging, as needed for discomfort.   Use over the counter normal saline nasal spray, as instructed in the Emergency Department, several times per day for the next 2 weeks. Take the prescriptions as directed. Take your usual prescriptions as previously directed.  Take your blood pressure only once per day, several days per week, either in the morning approximately 1 hour after you take your medicine(s) or in the evening before you go to bed.  Always sit quietly for at least 15 minutes before taking your blood pressure.  Keep a diary of your blood pressures to show your doctor at your follow up office visit.  Call your regular medical doctor today to schedule a follow up appointment in the next 2 days.  Return to the Emergency Department immediately if worsening.
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
## 2017-10-08 NOTE — ED Triage Notes (Signed)
Pt reports cough, body aches,chills for last several days. Pt denies any known fever.

## 2017-10-08 NOTE — ED Notes (Signed)
POC Urine Preg= Negative  

## 2017-10-09 ENCOUNTER — Telehealth: Payer: Self-pay | Admitting: Family Medicine

## 2017-10-09 NOTE — Telephone Encounter (Signed)
Patient was seen in [place] ER 10/08/17 am and was tested pos. for flu.  Patient has had 2 doses and throws up each time. Please advise Cb#: [phone]

## 2017-10-09 NOTE — Telephone Encounter (Signed)
You can send in some Zofran 4 mg to use every 6 hours as needed #20

## 2017-10-09 NOTE — Telephone Encounter (Signed)
Can you rx her something for nausea.

## 2017-10-10 MED ORDER — ONDANSETRON HCL 4 MG PO TABS: 4.0000 mg | ORAL_TABLET | Freq: Four times a day (QID) | ORAL | 0 refills | Status: DC | PRN

## 2017-10-10 NOTE — Telephone Encounter (Signed)
Done, Orlanda aware.

## 2017-11-05 ENCOUNTER — Encounter: Payer: Self-pay | Admitting: Family Medicine

## 2017-11-26 ENCOUNTER — Ambulatory Visit: Payer: Medicaid Other | Admitting: Family Medicine

## 2018-02-24 ENCOUNTER — Encounter (HOSPITAL_COMMUNITY): Payer: Self-pay | Admitting: Emergency Medicine

## 2018-02-24 ENCOUNTER — Emergency Department (HOSPITAL_COMMUNITY)
Admission: EM | Admit: 2018-02-24 | Discharge: 2018-02-24 | Disposition: A | Discharge status: Home / Self Care | Payer: Medicaid Other | Attending: Emergency Medicine | Admitting: Emergency Medicine

## 2018-02-24 ENCOUNTER — Other Ambulatory Visit: Payer: Self-pay

## 2018-02-24 DIAGNOSIS — F17210 Nicotine dependence, cigarettes, uncomplicated: Secondary | ICD-10-CM | POA: Insufficient documentation

## 2018-02-24 DIAGNOSIS — Z79899 Other long term (current) drug therapy: Secondary | ICD-10-CM | POA: Diagnosis not present

## 2018-02-24 DIAGNOSIS — I10 Essential (primary) hypertension: Secondary | ICD-10-CM | POA: Diagnosis not present

## 2018-02-24 DIAGNOSIS — B350 Tinea barbae and tinea capitis: Secondary | ICD-10-CM | POA: Diagnosis not present

## 2018-02-24 DIAGNOSIS — R51 Headache: Secondary | ICD-10-CM | POA: Diagnosis present

## 2018-02-24 MED ORDER — GRISEOFULVIN MICROSIZE 500 MG PO TABS: 500.0000 mg | ORAL_TABLET | Freq: Every day | ORAL | 0 refills | Status: AC

## 2018-02-24 MED ORDER — LABETALOL HCL 100 MG PO TABS: 200.0000 mg | ORAL_TABLET | Freq: Two times a day (BID) | ORAL | 3 refills | Status: DC

## 2018-02-24 NOTE — ED Triage Notes (Signed)
Pt c/o scalp pain x 2 weeks. Pt states it hurts to the touch.

## 2018-02-24 NOTE — ED Notes (Signed)
Pt states scalp pain for several weeks, had to have her hair cut d/t pain. Reports she last washed her scalp 1 month ago. Appears to have non-draining infected hairs.
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
## 2018-02-24 NOTE — Discharge Instructions (Signed)
Take antifungal medication as prescribed.  Keep hair clean and dry.  Follow-up with your primary care physician for reevaluation of your fungal infection and to your high blood pressure.  Continue to take your blood pressure medicines as prescribed.  Return to the emergency department immediately for any concerning signs or symptoms develop such as high fevers, worsening headache, neck stiffness, weakness, or passing out.   If your blood pressure (BP) was elevated on multiple readings during this visit above 130 for the top number or above 80 for the bottom number, please have this repeated by your primary care provider within one month. You can also check your blood pressure when you are out at a pharmacy or grocery store. Many have machines that will check your blood pressure.  If your blood pressure remains elevated, please follow-up with your PCP.

## 2018-03-17 IMAGING — DX DG CHEST 2V
2 series · 2 of 2 positions shown · non-contrast
Comparison: PA and lateral chest x-ray December 29, 2016

CLINICAL DATA: Cough, chest congestion, fever, and chest pain for
the past 3 days. Current smoker.

EXAM:
CHEST  2 VIEW

[chest pa]
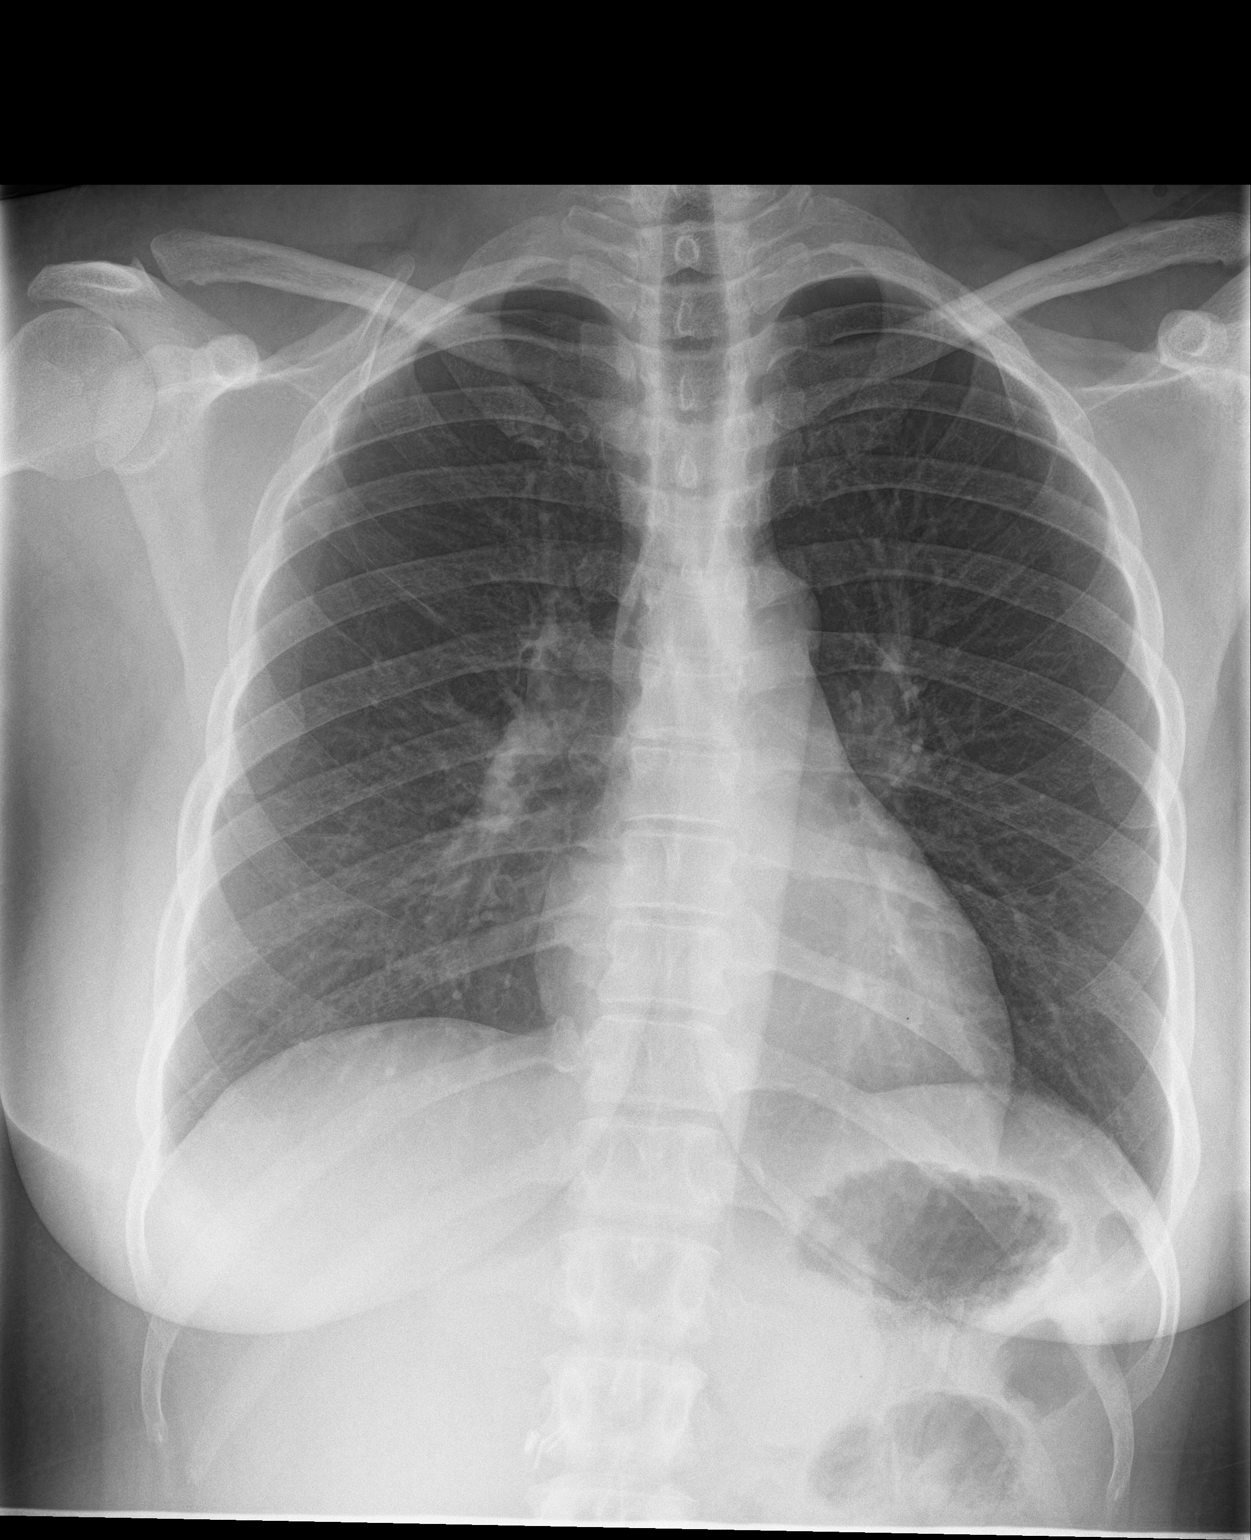

[chest lat]
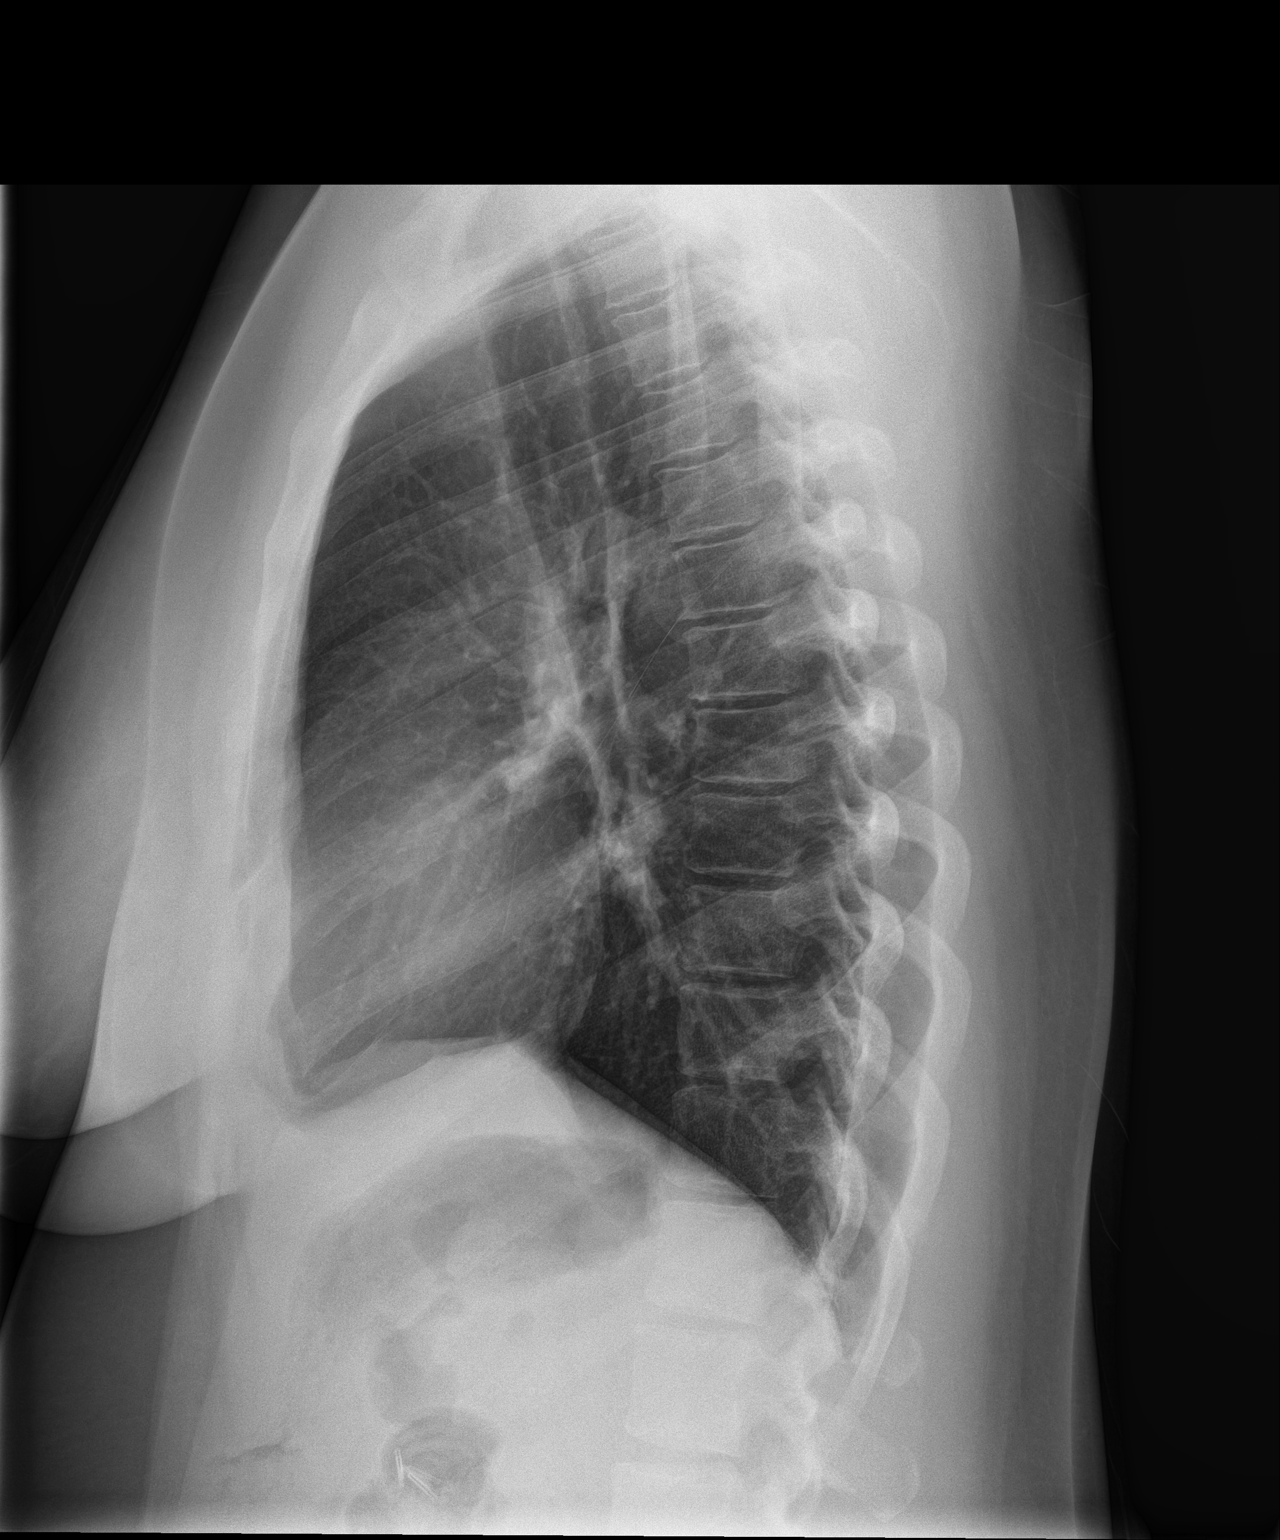

[2 of 2 positions shown; findings below may reference images not displayed]

FINDINGS: The lungs are well-expanded and clear. The heart and mediastinal
structures are normal. The trachea is midline. The bony thorax
exhibits no acute abnormality.
IMPRESSION: There is no active cardiopulmonary disease.

## 2018-11-04 ENCOUNTER — Encounter (HOSPITAL_COMMUNITY): Payer: Self-pay | Admitting: Emergency Medicine

## 2018-11-04 ENCOUNTER — Emergency Department (HOSPITAL_COMMUNITY)
Admission: EM | Admit: 2018-11-04 | Discharge: 2018-11-04 | Disposition: A | Discharge status: Home / Self Care | Payer: Medicaid Other | Attending: Emergency Medicine | Admitting: Emergency Medicine

## 2018-11-04 ENCOUNTER — Other Ambulatory Visit: Payer: Self-pay

## 2018-11-04 DIAGNOSIS — R51 Headache: Secondary | ICD-10-CM | POA: Insufficient documentation

## 2018-11-04 DIAGNOSIS — Z5321 Procedure and treatment not carried out due to patient leaving prior to being seen by health care provider: Secondary | ICD-10-CM | POA: Diagnosis not present

## 2018-11-04 NOTE — ED Triage Notes (Signed)
Pt states she has not had her blood pressure medications in almost a year and her head has been hurting for the past 2 days and she has just not been feeling well.  Feels her bp has been elevated.

## 2020-01-28 ENCOUNTER — Encounter (HOSPITAL_COMMUNITY): Payer: Self-pay

## 2020-01-28 ENCOUNTER — Emergency Department (HOSPITAL_COMMUNITY)
Admission: EM | Admit: 2020-01-28 | Discharge: 2020-01-28 | Disposition: A | Discharge status: Home / Self Care | Payer: Medicaid Other | Attending: Emergency Medicine | Admitting: Emergency Medicine

## 2020-01-28 ENCOUNTER — Other Ambulatory Visit: Payer: Self-pay

## 2020-01-28 ENCOUNTER — Emergency Department (HOSPITAL_COMMUNITY): Payer: Medicaid Other

## 2020-01-28 DIAGNOSIS — R109 Unspecified abdominal pain: Secondary | ICD-10-CM | POA: Insufficient documentation

## 2020-01-28 DIAGNOSIS — Z87891 Personal history of nicotine dependence: Secondary | ICD-10-CM | POA: Diagnosis not present

## 2020-01-28 DIAGNOSIS — K6289 Other specified diseases of anus and rectum: Secondary | ICD-10-CM | POA: Diagnosis present

## 2020-01-28 DIAGNOSIS — I10 Essential (primary) hypertension: Secondary | ICD-10-CM | POA: Insufficient documentation

## 2020-01-28 DIAGNOSIS — L03317 Cellulitis of buttock: Secondary | ICD-10-CM

## 2020-01-28 LAB — CBC WITH DIFFERENTIAL/PLATELET
Abs Immature Granulocytes: 0.02 10*3/uL (ref 0.00–0.07)
Basophils Absolute: 0.1 10*3/uL (ref 0.0–0.1)
Basophils Relative: 1 %
Eosinophils Absolute: 0 10*3/uL (ref 0.0–0.5)
Eosinophils Relative: 1 %
HCT: 38.9 % (ref 36.0–46.0)
Hemoglobin: 13.1 g/dL (ref 12.0–15.0)
Immature Granulocytes: 0 %
Lymphocytes Relative: 31 %
Lymphs Abs: 2.3 10*3/uL (ref 0.7–4.0)
MCH: 32.8 pg (ref 26.0–34.0)
MCHC: 33.7 g/dL (ref 30.0–36.0)
MCV: 97.5 fL (ref 80.0–100.0)
Monocytes Absolute: 0.5 10*3/uL (ref 0.1–1.0)
Monocytes Relative: 7 %
Neutro Abs: 4.6 10*3/uL (ref 1.7–7.7)
Neutrophils Relative %: 60 %
Platelets: 328 10*3/uL (ref 150–400)
RBC: 3.99 MIL/uL (ref 3.87–5.11)
RDW: 12.7 % (ref 11.5–15.5)
WBC: 7.5 10*3/uL (ref 4.0–10.5)
nRBC: 0 % (ref 0.0–0.2)

## 2020-01-28 LAB — BASIC METABOLIC PANEL
Anion gap: 10 (ref 5–15)
BUN: 11 mg/dL (ref 6–20)
CO2: 23 mmol/L (ref 22–32)
Calcium: 9.1 mg/dL (ref 8.9–10.3)
Chloride: 104 mmol/L (ref 98–111)
Creatinine, Ser: 0.83 mg/dL (ref 0.44–1.00)
GFR calc Af Amer: >60 mL/min (ref >60)
GFR calc non Af Amer: >60 mL/min (ref >60)
Glucose, Bld: 87 mg/dL (ref 70–99)
Potassium: 3.8 mmol/L (ref 3.5–5.1)
Sodium: 137 mmol/L (ref 135–145)

## 2020-01-28 LAB — POC URINE PREG, ED: Preg Test, Ur: NEGATIVE

## 2020-01-28 MED ORDER — DOXYCYCLINE HYCLATE 100 MG PO TABS
100.0000 mg | ORAL_TABLET | Freq: Once | ORAL | Status: AC
  Administered 2020-01-28: 100 mg via ORAL
  Filled 2020-01-28: qty 1

## 2020-01-28 MED ORDER — DOXYCYCLINE HYCLATE 100 MG PO CAPS
100.0000 mg | ORAL_CAPSULE | Freq: Two times a day (BID) | ORAL | 0 refills | Status: DC
Start: 2020-01-28 — End: 2023-01-30

## 2020-01-28 MED ORDER — IOHEXOL 300 MG/ML  SOLN
100.0000 mL | Freq: Once | INTRAMUSCULAR | Status: AC | PRN
  Administered 2020-01-28: 100 mL via INTRAVENOUS

## 2020-01-28 NOTE — Discharge Instructions (Addendum)
You have been seen today for possible abscess. Please read and follow all provided instructions. Return to the emergency room for worsening condition or new concerning symptoms.    The CT shows no evidence of pus collection. There is findings to suggest cellulitis, a skin infection. All your blood work was normal.  1. Medications:  Prescription sent to your pharmacy for doxycycline. Take with food so it does not cause upset stomach. -Tylenol or ibuprofen for pain as needed. Continue usual home medications Take medications as prescribed. Please review all of the medicines and only take them if you do not have an allergy to them.   2. Treatment: rest, drink plenty of fluids  3. Follow Up:  Please follow up with primary care provider by scheduling an appointment as soon as possible for a visit  If you do not have a primary care physician, contact [facility] at [phone] for referral -You can also follow up with the surgeon Dr. Henreitta Leber if infection does not get better with antibiotics.   It is also a possibility that you have an allergic reaction to any of the medicines that you have been prescribed - Everybody reacts differently to medications and while MOST people have no trouble with most medicines, you may have a reaction such as nausea, vomiting, rash, swelling, shortness of breath. If this is the case, please stop taking the medicine immediately and contact your physician.  ?

## 2020-01-28 NOTE — ED Notes (Signed)
Pt. With CT. 

## 2020-01-28 NOTE — ED Triage Notes (Signed)
Pt reports abscess to left buttock x 2 days.
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
## 2020-03-31 ENCOUNTER — Ambulatory Visit: Payer: Medicaid Other | Attending: Internal Medicine

## 2020-03-31 DIAGNOSIS — Z23 Encounter for immunization: Secondary | ICD-10-CM

## 2020-03-31 NOTE — Progress Notes (Signed)
   Covid-19 Vaccination Clinic  Name:  BREELLA VANOSTRAND    MRN: [id number] DOB: Oct 05, 1980  03/31/2020  Ms. Lothrop was observed post Covid-19 immunization for 15 minutes without incident. She was provided with Vaccine Information Sheet and instruction to access the V-Safe system.   Ms. Vickroy was instructed to call 911 with any severe reactions post vaccine: Marland Kitchen Difficulty breathing  . Swelling of face and throat  . A fast heartbeat  . A bad rash all over body  . Dizziness and weakness   Immunizations Administered    Name Date Dose VIS Date Route   Moderna COVID-19 Vaccine 03/31/2020  1:08 PM 0.5 mL 07/2019 Intramuscular   Manufacturer: Moderna   Lot: [id number]   NDC: 62831-517-61

## 2020-04-28 ENCOUNTER — Ambulatory Visit: Payer: Medicaid Other

## 2020-09-06 ENCOUNTER — Emergency Department (HOSPITAL_COMMUNITY)
Admission: EM | Admit: 2020-09-06 | Discharge: 2020-09-06 | Disposition: A | Discharge status: Home / Self Care | Payer: Medicaid Other | Attending: Emergency Medicine | Admitting: Emergency Medicine

## 2020-09-06 ENCOUNTER — Other Ambulatory Visit: Payer: Self-pay

## 2020-09-06 ENCOUNTER — Encounter (HOSPITAL_COMMUNITY): Payer: Self-pay

## 2020-09-06 DIAGNOSIS — Z87891 Personal history of nicotine dependence: Secondary | ICD-10-CM | POA: Diagnosis not present

## 2020-09-06 DIAGNOSIS — R112 Nausea with vomiting, unspecified: Secondary | ICD-10-CM | POA: Diagnosis present

## 2020-09-06 DIAGNOSIS — Z79899 Other long term (current) drug therapy: Secondary | ICD-10-CM | POA: Diagnosis not present

## 2020-09-06 DIAGNOSIS — I10 Essential (primary) hypertension: Secondary | ICD-10-CM | POA: Insufficient documentation

## 2020-09-06 DIAGNOSIS — U071 COVID-19: Secondary | ICD-10-CM | POA: Diagnosis not present

## 2020-09-06 LAB — CBC WITH DIFFERENTIAL/PLATELET
Abs Immature Granulocytes: 0.02 10*3/uL (ref 0.00–0.07)
Basophils Absolute: 0 10*3/uL (ref 0.0–0.1)
Basophils Relative: 0 %
Eosinophils Absolute: 0 10*3/uL (ref 0.0–0.5)
Eosinophils Relative: 0 %
HCT: 42.7 % (ref 36.0–46.0)
Hemoglobin: 14.6 g/dL (ref 12.0–15.0)
Immature Granulocytes: 0 %
Lymphocytes Relative: 14 %
Lymphs Abs: 0.9 10*3/uL (ref 0.7–4.0)
MCH: 31.2 pg (ref 26.0–34.0)
MCHC: 34.2 g/dL (ref 30.0–36.0)
MCV: 91.2 fL (ref 80.0–100.0)
Monocytes Absolute: 0.2 10*3/uL (ref 0.1–1.0)
Monocytes Relative: 3 %
Neutro Abs: 5.2 10*3/uL (ref 1.7–7.7)
Neutrophils Relative %: 83 %
Platelets: 271 10*3/uL (ref 150–400)
RBC: 4.68 MIL/uL (ref 3.87–5.11)
RDW: 13 % (ref 11.5–15.5)
WBC: 6.3 10*3/uL (ref 4.0–10.5)
nRBC: 0 % (ref 0.0–0.2)

## 2020-09-06 LAB — COMPREHENSIVE METABOLIC PANEL
ALT: 99 U/L — ABNORMAL HIGH (ref 0–44)
AST: 90 U/L — ABNORMAL HIGH (ref 15–41)
Albumin: 4.7 g/dL (ref 3.5–5.0)
Alkaline Phosphatase: 48 U/L (ref 38–126)
Anion gap: 13 (ref 5–15)
BUN: 16 mg/dL (ref 6–20)
CO2: 19 mmol/L — ABNORMAL LOW (ref 22–32)
Calcium: 9.5 mg/dL (ref 8.9–10.3)
Chloride: 107 mmol/L (ref 98–111)
Creatinine, Ser: 1.09 mg/dL — ABNORMAL HIGH (ref 0.44–1.00)
GFR, Estimated: >60 mL/min (ref >60)
Glucose, Bld: 149 mg/dL — ABNORMAL HIGH (ref 70–99)
Potassium: 3.2 mmol/L — ABNORMAL LOW (ref 3.5–5.1)
Sodium: 139 mmol/L (ref 135–145)
Total Bilirubin: 0.9 mg/dL (ref 0.3–1.2)
Total Protein: 9 g/dL — ABNORMAL HIGH (ref 6.5–8.1)

## 2020-09-06 LAB — RESP PANEL BY RT-PCR (FLU A&B, COVID) ARPGX2
Influenza A by PCR: NEGATIVE
Influenza B by PCR: NEGATIVE
SARS Coronavirus 2 by RT PCR: POSITIVE — AB

## 2020-09-06 LAB — LIPASE, BLOOD: Lipase: 18 U/L (ref 11–51)

## 2020-09-06 MED ORDER — PROMETHAZINE HCL 25 MG RE SUPP
25.0000 mg | Freq: Four times a day (QID) | RECTAL | 0 refills | Status: DC | PRN
Start: 2020-09-06 — End: 2020-09-10

## 2020-09-06 MED ORDER — IBUPROFEN 600 MG PO TABS
600.0000 mg | ORAL_TABLET | Freq: Four times a day (QID) | ORAL | 0 refills | Status: DC | PRN
Start: 2020-09-06 — End: 2023-01-30

## 2020-09-06 MED ORDER — FAMOTIDINE IN NACL 20-0.9 MG/50ML-% IV SOLN
20.0000 mg | Freq: Once | INTRAVENOUS | Status: AC
  Administered 2020-09-06: 20 mg via INTRAVENOUS
  Filled 2020-09-06: qty 50

## 2020-09-06 MED ORDER — ONDANSETRON 4 MG PO TBDP
4.0000 mg | ORAL_TABLET | Freq: Three times a day (TID) | ORAL | 0 refills | Status: DC | PRN
Start: 2020-09-06 — End: 2020-09-10

## 2020-09-06 MED ORDER — AMLODIPINE BESYLATE 5 MG PO TABS
5.0000 mg | ORAL_TABLET | Freq: Every day | ORAL | 0 refills | Status: DC
Start: 2020-09-06 — End: 2023-01-30

## 2020-09-06 MED ORDER — SODIUM CHLORIDE 0.9 % IV BOLUS
1000.0000 mL | Freq: Once | INTRAVENOUS | Status: AC
  Administered 2020-09-06: 1000 mL via INTRAVENOUS

## 2020-09-06 MED ORDER — PROMETHAZINE HCL 25 MG/ML IJ SOLN
25.0000 mg | Freq: Once | INTRAMUSCULAR | Status: AC
  Administered 2020-09-06: 25 mg via INTRAVENOUS
  Filled 2020-09-06: qty 1

## 2020-09-06 MED ORDER — ALUM & MAG HYDROXIDE-SIMETH 200-200-20 MG/5ML PO SUSP
30.0000 mL | Freq: Once | ORAL | Status: AC
  Administered 2020-09-06: 30 mL via ORAL
  Filled 2020-09-06: qty 30

## 2020-09-06 MED ORDER — ONDANSETRON HCL 4 MG/2ML IJ SOLN
4.0000 mg | Freq: Once | INTRAMUSCULAR | Status: AC
  Administered 2020-09-06: 4 mg via INTRAVENOUS
  Filled 2020-09-06: qty 2

## 2020-09-06 NOTE — Discharge Instructions (Addendum)
Person Under Monitoring Name: Carol Edwards  Location: [address] [place] [address]   Infection Prevention Recommendations for Individuals Confirmed to have, or Being Evaluated for, 2019 Novel Coronavirus (COVID-19) Infection Who Receive Care at Home  Individuals who are confirmed to have, or are being evaluated for, COVID-19 should follow the prevention steps below until a healthcare provider or local or state health department says they can return to normal activities.  Stay home except to get medical care You should restrict activities outside your home, except for getting medical care. Do not go to work, school, or public areas, and do not use public transportation or taxis.  Call ahead before visiting your doctor Before your medical appointment, call the healthcare provider and tell them that you have, or are being evaluated for, COVID-19 infection. This will help the healthcare provider's office take steps to keep other people from getting infected. Ask your healthcare provider to call the local or state health department.  Monitor your symptoms Seek prompt medical attention if your illness is worsening (e.g., difficulty breathing). Before going to your medical appointment, call the healthcare provider and tell them that you have, or are being evaluated for, COVID-19 infection. Ask your healthcare provider to call the local or state health department.  Wear a facemask You should wear a facemask that covers your nose and mouth when you are in the same room with other people and when you visit a healthcare provider. People who live with or visit you should also wear a facemask while they are in the same room with you.  Separate yourself from other people in your home As much as possible, you should stay in a different room from other people in your home. Also, you should use a separate bathroom, if available.  Avoid sharing household items You should not  share dishes, drinking glasses, cups, eating utensils, towels, bedding, or other items with other people in your home. After using these items, you should wash them thoroughly with soap and water.  Cover your coughs and sneezes Cover your mouth and nose with a tissue when you cough or sneeze, or you can cough or sneeze into your sleeve. Throw used tissues in a lined trash can, and immediately wash your hands with soap and water for at least 20 seconds or use an alcohol-based hand rub.  Wash your [employer] your hands often and thoroughly with soap and water for at least 20 seconds. You can use an alcohol-based hand sanitizer if soap and water are not available and if your hands are not visibly dirty. Avoid touching your eyes, nose, and mouth with unwashed hands.   Prevention Steps for Caregivers and Household Members of Individuals Confirmed to have, or Being Evaluated for, COVID-19 Infection Being Cared for in the Home  If you live with, or provide care at home for, a person confirmed to have, or being evaluated for, COVID-19 infection please follow these guidelines to prevent infection:  Follow healthcare provider's instructions Make sure that you understand and can help the patient follow any healthcare provider instructions for all care.  Provide for the patient's basic needs You should help the patient with basic needs in the home and provide support for getting groceries, prescriptions, and other personal needs.  Monitor the patient's symptoms If they are getting sicker, call his or her medical provider and tell them that the patient has, or is being evaluated for, COVID-19 infection. This will help the healthcare  provider's office take steps to keep other people from getting infected. Ask the healthcare provider to call the local or state health department.  Limit the number of people who have contact with the patient If possible, have only one caregiver for the  patient. Other household members should stay in another home or place of residence. If this is not possible, they should stay in another room, or be separated from the patient as much as possible. Use a separate bathroom, if available. Restrict visitors who do not have an essential need to be in the home.  Keep older adults, very young children, and other sick people away from the patient Keep older adults, very young children, and those who have compromised immune systems or chronic health conditions away from the patient. This includes people with chronic heart, lung, or kidney conditions, diabetes, and cancer.  Ensure good ventilation Make sure that shared spaces in the home have good air flow, such as from an air conditioner or an opened window, weather permitting.  Wash your hands often Wash your hands often and thoroughly with soap and water for at least 20 seconds. You can use an alcohol based hand sanitizer if soap and water are not available and if your hands are not visibly dirty. Avoid touching your eyes, nose, and mouth with unwashed hands. Use disposable paper towels to dry your hands. If not available, use dedicated cloth towels and replace them when they become wet.  Wear a facemask and gloves Wear a disposable facemask at all times in the room and gloves when you touch or have contact with the patient's blood, body fluids, and/or secretions or excretions, such as sweat, saliva, sputum, nasal mucus, vomit, urine, or feces.  Ensure the mask fits over your nose and mouth tightly, and do not touch it during use. Throw out disposable facemasks and gloves after using them. Do not reuse. Wash your hands immediately after removing your facemask and gloves. If your personal clothing becomes contaminated, carefully remove clothing and launder. Wash your hands after handling contaminated clothing. Place all used disposable facemasks, gloves, and other waste in a lined container before  disposing them with other household waste. Remove gloves and wash your hands immediately after handling these items.  Do not share dishes, glasses, or other household items with the patient Avoid sharing household items. You should not share dishes, drinking glasses, cups, eating utensils, towels, bedding, or other items with a patient who is confirmed to have, or being evaluated for, COVID-19 infection. After the person uses these items, you should wash them thoroughly with soap and water.  Wash laundry thoroughly Immediately remove and wash clothes or bedding that have blood, body fluids, and/or secretions or excretions, such as sweat, saliva, sputum, nasal mucus, vomit, urine, or feces, on them. Wear gloves when handling laundry from the patient. Read and follow directions on labels of laundry or clothing items and detergent. In general, wash and dry with the warmest temperatures recommended on the label.  Clean all areas the individual has used often Clean all touchable surfaces, such as counters, tabletops, doorknobs, bathroom fixtures, toilets, phones, keyboards, tablets, and bedside tables, every day. Also, clean any surfaces that may have blood, body fluids, and/or secretions or excretions on them. Wear gloves when cleaning surfaces the patient has come in contact with. Use a diluted bleach solution (e.g., dilute bleach with 1 part bleach and 10 parts water) or a household disinfectant with a label that says EPA-registered for coronaviruses. To make  a bleach solution at home, add 1 tablespoon of bleach to 1 quart (4 cups) of water. For a larger supply, add  cup of bleach to 1 gallon (16 cups) of water. Read labels of cleaning products and follow recommendations provided on product labels. Labels contain instructions for safe and effective use of the cleaning product including precautions you should take when applying the product, such as wearing gloves or eye protection and making sure you  have good ventilation during use of the product. Remove gloves and wash hands immediately after cleaning.  Monitor yourself for signs and symptoms of illness Caregivers and household members are considered close contacts, should monitor their health, and will be asked to limit movement outside of the home to the extent possible. Follow the monitoring steps for close contacts listed on the symptom monitoring form.   ? If you have additional questions, contact your local health department or call the epidemiologist on call at [phone] (available 24/7). ? This guidance is subject to change. For the most up-to-date guidance from [facility], please refer to their website: YouBlogs.pl

## 2020-09-06 NOTE — ED Notes (Signed)
CRITICAL VALUE ALERT  Critical Value:  Covid Positive  Date & Time Notied:  09/06/2020 @ 1652  Provider Notified: Dr Particia Nearing  Orders Received/Actions taken: see new orders.

## 2020-09-06 NOTE — ED Triage Notes (Signed)
Pt to er, pt states that she has had one shot of the mederna, states that she is here for cough, fever, chills and vomiting, states that she started feeling poorly about three days ago, states that she can't keep anything down.
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
## 2020-09-06 NOTE — Clinical Social Work Note (Signed)
Transition of Care [facility]) - Emergency Department Mini Assessment   Patient Details  Name: Carol Edwards MRN: [id number] Date of Birth: 02-07-1981  [facility] CM/SW Contact:    Villa Herb, LCSWA Phone Number: 09/06/2020, 8:36 PM   Clinical Narrative: TOC consulted due to pt needing PCP establishment. CSW provided pt with list of local PCP accepting new patients. CSW explained list to pt and how to establish self with PCP. TOC signing off.    ED Mini Assessment: What brought you to the Emergency Department? : cough, fever, chills and vomiting  Barriers to Discharge: Barriers Resolved,ED PCP establishment  Barrier interventions: PCP resource list provided  Means of departure: Car  Interventions which prevented an admission or readmission:  (PCP resource list)    Patient Contact and Communications        ,          Patient states their goals for this hospitalization and ongoing recovery are:: Return home   Choice offered to / list presented to : NA  Admission diagnosis:  emesis ha bodyache Patient Active Problem List   Diagnosis Date Noted  . Hyperemesis gravidarum 03/28/2017  . Hyperemesis gravidarum before end of [redacted] week gestation with dehydration 03/28/2017  . Vitamin D deficiency 01/02/2017  . Essential hypertension 10/02/2016  . Tobacco abuse 10/02/2016  . Mental disability 10/02/2016  . Genital herpes 10/02/2016  . Chronic nonintractable headache 10/02/2016   PCP:  Patient, No Pcp Per Pharmacy:   [facility] - [place],  - [address] [place] [address] Phone: [phone] Fax: [phone]

## 2020-09-10 ENCOUNTER — Encounter (HOSPITAL_COMMUNITY): Payer: Self-pay | Admitting: *Deleted

## 2020-09-10 ENCOUNTER — Emergency Department (HOSPITAL_COMMUNITY): Payer: Medicaid Other

## 2020-09-10 ENCOUNTER — Other Ambulatory Visit: Payer: Self-pay

## 2020-09-10 ENCOUNTER — Emergency Department (HOSPITAL_COMMUNITY)
Admission: EM | Admit: 2020-09-10 | Discharge: 2020-09-10 | Disposition: A | Discharge status: Home / Self Care | Payer: Medicaid Other | Attending: Emergency Medicine | Admitting: Emergency Medicine

## 2020-09-10 DIAGNOSIS — U071 COVID-19: Secondary | ICD-10-CM | POA: Diagnosis not present

## 2020-09-10 DIAGNOSIS — R112 Nausea with vomiting, unspecified: Secondary | ICD-10-CM

## 2020-09-10 DIAGNOSIS — I10 Essential (primary) hypertension: Secondary | ICD-10-CM | POA: Insufficient documentation

## 2020-09-10 DIAGNOSIS — R52 Pain, unspecified: Secondary | ICD-10-CM

## 2020-09-10 DIAGNOSIS — R197 Diarrhea, unspecified: Secondary | ICD-10-CM

## 2020-09-10 DIAGNOSIS — R072 Precordial pain: Secondary | ICD-10-CM

## 2020-09-10 DIAGNOSIS — R000 Tachycardia, unspecified: Secondary | ICD-10-CM | POA: Diagnosis not present

## 2020-09-10 DIAGNOSIS — Z87891 Personal history of nicotine dependence: Secondary | ICD-10-CM | POA: Insufficient documentation

## 2020-09-10 DIAGNOSIS — E876 Hypokalemia: Secondary | ICD-10-CM | POA: Diagnosis not present

## 2020-09-10 HISTORY — DX: COVID-19: U07.1

## 2020-09-10 LAB — LIPASE, BLOOD: Lipase: 17 U/L (ref 11–51)

## 2020-09-10 LAB — CBC WITH DIFFERENTIAL/PLATELET
Abs Immature Granulocytes: 0.03 10*3/uL (ref 0.00–0.07)
Basophils Absolute: 0 10*3/uL (ref 0.0–0.1)
Basophils Relative: 0 %
Eosinophils Absolute: 0 10*3/uL (ref 0.0–0.5)
Eosinophils Relative: 0 %
HCT: 44.7 % (ref 36.0–46.0)
Hemoglobin: 15.4 g/dL — ABNORMAL HIGH (ref 12.0–15.0)
Immature Granulocytes: 0 %
Lymphocytes Relative: 22 %
Lymphs Abs: 2.2 10*3/uL (ref 0.7–4.0)
MCH: 30.9 pg (ref 26.0–34.0)
MCHC: 34.5 g/dL (ref 30.0–36.0)
MCV: 89.8 fL (ref 80.0–100.0)
Monocytes Absolute: 0.8 10*3/uL (ref 0.1–1.0)
Monocytes Relative: 8 %
Neutro Abs: 6.7 10*3/uL (ref 1.7–7.7)
Neutrophils Relative %: 70 %
Platelets: 326 10*3/uL (ref 150–400)
RBC: 4.98 MIL/uL (ref 3.87–5.11)
RDW: 12.4 % (ref 11.5–15.5)
WBC: 9.8 10*3/uL (ref 4.0–10.5)
nRBC: 0 % (ref 0.0–0.2)

## 2020-09-10 LAB — COMPREHENSIVE METABOLIC PANEL
ALT: 123 U/L — ABNORMAL HIGH (ref 0–44)
AST: 37 U/L (ref 15–41)
Albumin: 4.7 g/dL (ref 3.5–5.0)
Alkaline Phosphatase: 50 U/L (ref 38–126)
Anion gap: 13 (ref 5–15)
BUN: 12 mg/dL (ref 6–20)
CO2: 19 mmol/L — ABNORMAL LOW (ref 22–32)
Calcium: 9.7 mg/dL (ref 8.9–10.3)
Chloride: 105 mmol/L (ref 98–111)
Creatinine, Ser: 0.94 mg/dL (ref 0.44–1.00)
GFR, Estimated: >60 mL/min (ref >60)
Glucose, Bld: 120 mg/dL — ABNORMAL HIGH (ref 70–99)
Potassium: 2.5 mmol/L — CL (ref 3.5–5.1)
Sodium: 137 mmol/L (ref 135–145)
Total Bilirubin: 1.2 mg/dL (ref 0.3–1.2)
Total Protein: 9.1 g/dL — ABNORMAL HIGH (ref 6.5–8.1)

## 2020-09-10 LAB — I-STAT BETA HCG BLOOD, ED (MC, WL, AP ONLY): I-stat hCG, quantitative: <5 m[IU]/mL (ref <5)

## 2020-09-10 LAB — D-DIMER, QUANTITATIVE: D-Dimer, Quant: 0.62 ug/mL-FEU — ABNORMAL HIGH (ref 0.00–0.50)

## 2020-09-10 LAB — TROPONIN I (HIGH SENSITIVITY)
Troponin I (High Sensitivity): 14 ng/L (ref <18)
Troponin I (High Sensitivity): 13 ng/L (ref <18)

## 2020-09-10 MED ORDER — DICYCLOMINE HCL 10 MG PO CAPS: 10.0000 mg | ORAL_CAPSULE | Freq: Three times a day (TID) | ORAL | 0 refills | Status: DC | PRN

## 2020-09-10 MED ORDER — PROMETHAZINE HCL 25 MG PO TABS: 25.0000 mg | ORAL_TABLET | Freq: Four times a day (QID) | ORAL | 0 refills | Status: DC | PRN

## 2020-09-10 MED ORDER — POTASSIUM CHLORIDE 20 MEQ PO PACK
40.0000 meq | PACK | Freq: Once | ORAL | Status: AC
  Administered 2020-09-10: 40 meq via ORAL
  Filled 2020-09-10: qty 2

## 2020-09-10 MED ORDER — IOHEXOL 350 MG/ML SOLN
75.0000 mL | Freq: Once | INTRAVENOUS | Status: AC | PRN
  Administered 2020-09-10: 75 mL via INTRAVENOUS

## 2020-09-10 MED ORDER — POTASSIUM CHLORIDE CRYS ER 20 MEQ PO TBCR: 20.0000 meq | EXTENDED_RELEASE_TABLET | Freq: Every day | ORAL | 0 refills | Status: DC

## 2020-09-10 MED ORDER — MORPHINE SULFATE (PF) 4 MG/ML IV SOLN
4.0000 mg | Freq: Once | INTRAVENOUS | Status: AC
  Administered 2020-09-10: 4 mg via INTRAVENOUS
  Filled 2020-09-10: qty 1

## 2020-09-10 MED ORDER — SODIUM CHLORIDE 0.9 % IV BOLUS
1000.0000 mL | Freq: Once | INTRAVENOUS | Status: AC
  Administered 2020-09-10: 1000 mL via INTRAVENOUS

## 2020-09-10 MED ORDER — ONDANSETRON HCL 4 MG/2ML IJ SOLN
4.0000 mg | Freq: Once | INTRAMUSCULAR | Status: AC
  Administered 2020-09-10: 4 mg via INTRAVENOUS
  Filled 2020-09-10: qty 2

## 2020-09-10 MED ORDER — LACTATED RINGERS IV SOLN: INTRAVENOUS | Status: DC

## 2020-09-10 MED ORDER — POTASSIUM CHLORIDE 10 MEQ/100ML IV SOLN
10.0000 meq | INTRAVENOUS | Status: AC
  Administered 2020-09-10 (×3): 10 meq via INTRAVENOUS
  Filled 2020-09-10 (×3): qty 100

## 2020-09-10 MED ORDER — POTASSIUM CHLORIDE CRYS ER 20 MEQ PO TBCR
40.0000 meq | EXTENDED_RELEASE_TABLET | Freq: Once | ORAL | Status: DC
  Filled 2020-09-10: qty 2

## 2020-09-10 NOTE — ED Notes (Signed)
Date and time results received: 09/10/20 1722 (use smartphrase ".now" to insert current time)  Test: Potassium Critical Value: 2.5  Name of Provider Notified: Dr Jacqulyn Bath  Orders Received? Or Actions Taken?:NA
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
## 2020-09-10 NOTE — Discharge Instructions (Signed)
You were seen in the emergency department today with nausea and vomiting. Your potassium is low from vomiting. We have been able to control your symptoms here in the emergency department. I am calling in some medication to your pharmacy to help with your potassium as well as your symptoms. Please take these medications and return to the emergency department if you develop any new or suddenly worsening symptoms. Please follow with your primary care doctor in the coming week for repeat potassium testing to make sure its return to normal.

## 2020-09-10 NOTE — ED Triage Notes (Signed)
Pt c/o emesis and cp x 4 days.  [facility] brought pt here, stated she was hyperventilating at home.  RA sats 100% with EMS

## 2020-09-10 NOTE — ED Notes (Signed)
Pt sipping on water 

## 2023-01-23 ENCOUNTER — Emergency Department (HOSPITAL_COMMUNITY)
Admission: EM | Admit: 2023-01-23 | Discharge: 2023-01-23 | Disposition: A | Discharge status: Home / Self Care | Payer: Medicaid Other | Attending: Emergency Medicine | Admitting: Emergency Medicine

## 2023-01-23 ENCOUNTER — Other Ambulatory Visit: Payer: Self-pay

## 2023-01-23 ENCOUNTER — Encounter (HOSPITAL_COMMUNITY): Payer: Self-pay | Admitting: Emergency Medicine

## 2023-01-23 DIAGNOSIS — R112 Nausea with vomiting, unspecified: Secondary | ICD-10-CM | POA: Insufficient documentation

## 2023-01-23 DIAGNOSIS — I10 Essential (primary) hypertension: Secondary | ICD-10-CM | POA: Insufficient documentation

## 2023-01-23 LAB — COMPREHENSIVE METABOLIC PANEL
ALT: 20 U/L (ref 0–44)
AST: 24 U/L (ref 15–41)
Albumin: 4.6 g/dL (ref 3.5–5.0)
Alkaline Phosphatase: 48 U/L (ref 38–126)
Anion gap: 17 — ABNORMAL HIGH (ref 5–15)
BUN: 12 mg/dL (ref 6–20)
CO2: 17 mmol/L — ABNORMAL LOW (ref 22–32)
Calcium: 9.6 mg/dL (ref 8.9–10.3)
Chloride: 101 mmol/L (ref 98–111)
Creatinine, Ser: 0.91 mg/dL (ref 0.44–1.00)
GFR, Estimated: >60 mL/min (ref >60)
Glucose, Bld: 133 mg/dL — ABNORMAL HIGH (ref 70–99)
Potassium: 3.9 mmol/L (ref 3.5–5.1)
Sodium: 135 mmol/L (ref 135–145)
Total Bilirubin: 1 mg/dL (ref 0.3–1.2)
Total Protein: 9 g/dL — ABNORMAL HIGH (ref 6.5–8.1)

## 2023-01-23 LAB — CBC
HCT: 44 % (ref 36.0–46.0)
Hemoglobin: 14.9 g/dL (ref 12.0–15.0)
MCH: 31.1 pg (ref 26.0–34.0)
MCHC: 33.9 g/dL (ref 30.0–36.0)
MCV: 91.9 fL (ref 80.0–100.0)
Platelets: 390 10*3/uL (ref 150–400)
RBC: 4.79 MIL/uL (ref 3.87–5.11)
RDW: 12.7 % (ref 11.5–15.5)
WBC: 7.6 10*3/uL (ref 4.0–10.5)
nRBC: 0 % (ref 0.0–0.2)

## 2023-01-23 LAB — URINALYSIS, ROUTINE W REFLEX MICROSCOPIC
Bilirubin Urine: NEGATIVE
Glucose, UA: NEGATIVE mg/dL
Hgb urine dipstick: NEGATIVE
Ketones, ur: 20 mg/dL — AB
Leukocytes,Ua: NEGATIVE
Nitrite: NEGATIVE
Protein, ur: 100 mg/dL — AB
Specific Gravity, Urine: 1.023 (ref 1.005–1.030)
pH: 7 (ref 5.0–8.0)

## 2023-01-23 LAB — LIPASE, BLOOD: Lipase: 23 U/L (ref 11–51)

## 2023-01-23 LAB — TROPONIN I (HIGH SENSITIVITY): Troponin I (High Sensitivity): 2 ng/L (ref <18)

## 2023-01-23 LAB — PREGNANCY, URINE: Preg Test, Ur: POSITIVE — AB

## 2023-01-23 MED ORDER — DOXYLAMINE-PYRIDOXINE 10-10 MG PO TBEC
1.0000 | DELAYED_RELEASE_TABLET | Freq: Three times a day (TID) | ORAL | 0 refills | Status: DC | PRN
Start: 2023-01-23 — End: 2023-01-30

## 2023-01-23 MED ORDER — LACTATED RINGERS IV BOLUS
1000.0000 mL | Freq: Once | INTRAVENOUS | Status: AC
  Administered 2023-01-23: 1000 mL via INTRAVENOUS

## 2023-01-23 MED ORDER — METOCLOPRAMIDE HCL 10 MG PO TABS: 10.0000 mg | ORAL_TABLET | Freq: Three times a day (TID) | ORAL | 0 refills | Status: DC | PRN

## 2023-01-23 MED ORDER — METOCLOPRAMIDE HCL 5 MG/ML IJ SOLN
10.0000 mg | Freq: Once | INTRAMUSCULAR | Status: AC
  Administered 2023-01-23: 10 mg via INTRAVENOUS
  Filled 2023-01-23: qty 2

## 2023-01-23 MED ORDER — ONDANSETRON HCL 4 MG/2ML IJ SOLN
4.0000 mg | Freq: Once | INTRAMUSCULAR | Status: AC
  Administered 2023-01-23: 4 mg via INTRAVENOUS
  Filled 2023-01-23: qty 2

## 2023-01-23 MED ORDER — METOCLOPRAMIDE HCL 5 MG/ML IJ SOLN: 10.0000 mg | Freq: Once | INTRAMUSCULAR | Status: DC

## 2023-01-23 MED ORDER — ONDANSETRON HCL 4 MG/2ML IJ SOLN
4.0000 mg | Freq: Once | INTRAMUSCULAR | Status: AC | PRN
Start: 2023-01-23 — End: 2023-01-23
  Administered 2023-01-23: 4 mg via INTRAVENOUS
  Filled 2023-01-23: qty 2

## 2023-01-23 NOTE — Discharge Instructions (Addendum)
There were medications sent to your pharmacy: -Doxylamine-pyridoxine can be used as a step 1 treatment for nausea and vomiting.  Take this as needed. -Take Reglan as needed if the doxylamine-pyridoxine is not effective.  Call the telephone number below to schedule a follow-up appointment with OB/GYN.  Return to the emergency department for uncontrolled vomiting or any other symptoms of concern.

## 2023-01-23 NOTE — ED Provider Notes (Signed)
[place] EMERGENCY DEPARTMENT AT [facility] Provider Note   CSN: [id number] Arrival date & time: 01/23/23  1112     History  Chief Complaint  Patient presents with   Emesis    Carol Edwards is a 42 y.o. female.   Emesis Associated symptoms: abdominal pain   Patient presents for nausea and vomiting.  Onset was 3 days ago.  Prior to that, she was in her normal state of health.  She describes persistent nausea, vomiting, and p.o. intolerance over the past 3 days.  She has had pain in her lower abdomen and sides of her abdomen which she attributes to the frequent vomiting.  She has not had any dysuria.  She has not had any chest pain or shortness of breath.  Patient states that she has had severe episodes of vomiting from COVID-19 infection and during pregnancy.  Her LMP was 4 weeks ago.     Home Medications Prior to Admission medications   Medication Sig Start Date End Date Taking? Authorizing Provider  Doxylamine-Pyridoxine 10-10 MG TBEC Take 1 tablet by mouth every 8 (eight) hours as needed for up to 30 doses. 01/23/23  Yes Gloris Manchester, MD  metoCLOPramide (REGLAN) 10 MG tablet Take 1 tablet (10 mg total) by mouth every 8 (eight) hours as needed for nausea. 01/23/23  Yes Gloris Manchester, MD  acetaminophen (TYLENOL) 500 MG tablet Take 500 mg by mouth every 6 (six) hours as needed for mild pain or moderate pain.    [provider]  amLODipine (NORVASC) 5 MG tablet Take 1 tablet (5 mg total) by mouth daily. 09/06/20   Jacalyn Lefevre, MD  dicyclomine (BENTYL) 10 MG capsule Take 1 capsule (10 mg total) by mouth 3 (three) times daily as needed for up to 10 days for spasms. 09/10/20 09/20/20  Long, Arlyss Repress, MD  doxycycline (VIBRAMYCIN) 100 MG capsule Take 1 capsule (100 mg total) by mouth 2 (two) times daily. 01/28/20   Walisiewicz, Yvonna Alanis E, PA-C  ibuprofen (ADVIL) 600 MG tablet Take 1 tablet (600 mg total) by mouth every 6 (six) hours as needed. 09/06/20   Jacalyn Lefevre, MD   potassium chloride SA (KLOR-CON) 20 MEQ tablet Take 1 tablet (20 mEq total) by mouth daily for 5 days. 09/11/20 09/16/20  Long, Arlyss Repress, MD  promethazine (PHENERGAN) 25 MG tablet Take 1 tablet (25 mg total) by mouth every 6 (six) hours as needed for nausea or vomiting. 09/10/20   Long, Arlyss Repress, MD      Allergies    Patient has no known allergies.    Review of Systems   Review of Systems  Gastrointestinal:  Positive for abdominal pain, nausea and vomiting.  All other systems reviewed and are negative.   Physical Exam Updated Vital Signs BP (!) 148/99 (BP Location: Right Arm)   Pulse 100   Temp 98.4 F (36.9 C)   Resp 18   Ht 5\' 1"  (1.549 m)   Wt 64.4 kg   LMP 12/26/2022 (Approximate)   SpO2 100%   BMI 26.83 kg/m  Physical Exam Vitals and nursing note reviewed.  Constitutional:      General: She is not in acute distress.    Appearance: Normal appearance. She is well-developed. She is not ill-appearing, toxic-appearing or diaphoretic.  HENT:     Head: Normocephalic and atraumatic.     Right Ear: External ear normal.     Left Ear: External ear normal.     Nose: Nose normal.  Mouth/Throat:     Mouth: Mucous membranes are moist.  Eyes:     General: No scleral icterus.    Extraocular Movements: Extraocular movements intact.     Conjunctiva/sclera: Conjunctivae normal.  Cardiovascular:     Rate and Rhythm: Normal rate and regular rhythm.  Pulmonary:     Effort: Pulmonary effort is normal. No respiratory distress.     Breath sounds: Normal breath sounds.  Abdominal:     General: There is no distension.     Palpations: Abdomen is soft.     Tenderness: There is no abdominal tenderness.  Musculoskeletal:        General: No swelling. Normal range of motion.     Cervical back: Normal range of motion and neck supple.  Skin:    General: Skin is warm and dry.     Coloration: Skin is not jaundiced or pale.  Neurological:     General: No focal deficit present.     Mental  Status: She is alert and oriented to person, place, and time.  Psychiatric:        Mood and Affect: Mood normal.        Behavior: Behavior normal.     ED Results / Procedures / Treatments   Labs (all labs ordered are listed, but only abnormal results are displayed) Labs Reviewed  COMPREHENSIVE METABOLIC PANEL - Abnormal; Notable for the following components:      Result Value   CO2 17 (*)    Glucose, Bld 133 (*)    Total Protein 9.0 (*)    Anion gap 17 (*)    All other components within normal limits  URINALYSIS, ROUTINE W REFLEX MICROSCOPIC - Abnormal; Notable for the following components:   APPearance HAZY (*)    Ketones, ur 20 (*)    Protein, ur 100 (*)    All other components within normal limits  PREGNANCY, URINE - Abnormal; Notable for the following components:   Preg Test, Ur POSITIVE (*)    All other components within normal limits  LIPASE, BLOOD  CBC  TROPONIN I (HIGH SENSITIVITY)    EKG EKG Interpretation  Date/Time:  Tuesday Jan 23 2023 11:55:04 EDT Ventricular Rate:  115 PR Interval:  124 QRS Duration: 74 QT Interval:  324 QTC Calculation: 448 R Axis:   66 Text Interpretation: Sinus tachycardia Confirmed by Gloris Manchester ([id number]) on 01/23/2023 12:36:54 PM  Radiology No results found.  Procedures Procedures    Medications Ordered in ED Medications  ondansetron (ZOFRAN) injection 4 mg (4 mg Intravenous Given 01/23/23 1208)  lactated ringers bolus 1,000 mL (0 mLs Intravenous Stopped 01/23/23 1338)  ondansetron (ZOFRAN) injection 4 mg (4 mg Intravenous Given 01/23/23 1406)  lactated ringers bolus 1,000 mL (0 mLs Intravenous Stopped 01/23/23 1628)  metoCLOPramide (REGLAN) injection 10 mg (10 mg Intravenous Given 01/23/23 1550)    ED Course/ Medical Decision Making/ A&P                             Medical Decision Making Amount and/or Complexity of Data Reviewed Labs: ordered.  Risk Prescription drug management.   This patient presents to the ED for  concern of nausea and vomiting, this involves an extensive number of treatment options, and is a complaint that carries with it a high risk of complications and morbidity.  The differential diagnosis includes enteritis, gastritis, GERD, cholecystitis, pregnancy, cyclic vomiting, pneumonia, other infection, metabolic derangements, dehydration   Co morbidities  that complicate the patient evaluation  HTN   Additional history obtained:  Additional history obtained from N/A External records from outside source obtained and reviewed including EMR   Lab Tests:  I Ordered, and personally interpreted labs.  The pertinent results include: Normal hemoglobin, no leukocytosis, anion gap metabolic acidosis with ketonuria present urine consistent with starvation ketosis.  No evidence of UTI.  Normal lipase and hepatobiliary enzymes.  Normal troponin.  Urine pregnancy test was positive.  Cardiac Monitoring: / EKG:  The patient was maintained on a cardiac monitor.  I personally viewed and interpreted the cardiac monitored which showed an underlying rhythm of: Sinus rhythm   Problem List / ED Course / Critical interventions / Medication management  Patient presents for nausea, vomiting, and p.o. intolerance for the past 3 days.  She is actively vomiting on arrival in the ED.  Zofran was given with improvement in symptoms.  IV fluids ordered for suspected dehydration.  Laboratory workup was initiated.  Lab work showed anion gap metabolic acidosis consistent with starvation ketosis from persistent vomiting and p.o. intolerance.  Urine pregnancy test was positive.  This does explain her recent symptoms.  On reassessment, patient reported improvement in her nausea.  She was given food and drink.  On further reassessment, patient had recurrence of nausea.  Reglan was given with good effect.  She had sustained resolution of nausea and was able to tolerate p.o. intake.  She received a total of 2 L IVF.  She was  informed of her positive pregnancy test.  Currently, she does not have an OB/GYN.  She was given contact information to establish care.  She was prescribed Diclegis as well as Reglan and advised to take Reglan if Diclegis is not effective.  She was discharged in stable condition. I ordered medication including IV fluids for duration; Zofran and Reglan for her nausea Reevaluation of the patient after these medicines showed that the patient resolved I have reviewed the patients home medicines and have made adjustments as needed   Social Determinants of Health:  Does not have PCP         Final Clinical Impression(s) / ED Diagnoses Final diagnoses:  Nausea and vomiting, unspecified vomiting type    Rx / DC Orders ED Discharge Orders          Ordered    Doxylamine-Pyridoxine 10-10 MG TBEC  Every 8 hours PRN        01/23/23 1630    metoCLOPramide (REGLAN) 10 MG tablet  Every 8 hours PRN        01/23/23 1630              Gloris Manchester, MD 01/23/23 1745

## 2023-01-23 NOTE — ED Triage Notes (Addendum)
Pt c/o 3 days of vomiting, estimated 10 episodes in the past 24 hours. Pt has also been dizzy and had diarrhea twice today. Reports mid chest pain 5/10 nonradiating and is actively vomiting in triage.

## 2023-01-24 NOTE — ED Notes (Signed)
Pt came back for work-note.

## 2023-01-28 ENCOUNTER — Emergency Department (HOSPITAL_COMMUNITY): Payer: Medicaid Other

## 2023-01-28 ENCOUNTER — Other Ambulatory Visit: Payer: Self-pay

## 2023-01-28 ENCOUNTER — Emergency Department (HOSPITAL_COMMUNITY)
Admission: EM | Admit: 2023-01-28 | Discharge: 2023-01-28 | Disposition: A | Discharge status: Home / Self Care | Payer: Medicaid Other | Attending: Student | Admitting: Student

## 2023-01-28 DIAGNOSIS — O219 Vomiting of pregnancy, unspecified: Secondary | ICD-10-CM | POA: Diagnosis not present

## 2023-01-28 DIAGNOSIS — Z87891 Personal history of nicotine dependence: Secondary | ICD-10-CM | POA: Diagnosis not present

## 2023-01-28 DIAGNOSIS — I10 Essential (primary) hypertension: Secondary | ICD-10-CM | POA: Insufficient documentation

## 2023-01-28 DIAGNOSIS — Z8616 Personal history of COVID-19: Secondary | ICD-10-CM | POA: Diagnosis not present

## 2023-01-28 DIAGNOSIS — R1110 Vomiting, unspecified: Secondary | ICD-10-CM

## 2023-01-28 LAB — CBC WITH DIFFERENTIAL/PLATELET
Abs Immature Granulocytes: 0.03 10*3/uL (ref 0.00–0.07)
Basophils Absolute: 0.1 10*3/uL (ref 0.0–0.1)
Basophils Relative: 1 %
Eosinophils Absolute: 0 10*3/uL (ref 0.0–0.5)
Eosinophils Relative: 0 %
HCT: 43 % (ref 36.0–46.0)
Hemoglobin: 14.9 g/dL (ref 12.0–15.0)
Immature Granulocytes: 0 %
Lymphocytes Relative: 22 %
Lymphs Abs: 1.7 10*3/uL (ref 0.7–4.0)
MCH: 31.2 pg (ref 26.0–34.0)
MCHC: 34.7 g/dL (ref 30.0–36.0)
MCV: 90.1 fL (ref 80.0–100.0)
Monocytes Absolute: 0.8 10*3/uL (ref 0.1–1.0)
Monocytes Relative: 10 %
Neutro Abs: 5.2 10*3/uL (ref 1.7–7.7)
Neutrophils Relative %: 67 %
Platelets: 372 10*3/uL (ref 150–400)
RBC: 4.77 MIL/uL (ref 3.87–5.11)
RDW: 12.6 % (ref 11.5–15.5)
WBC: 7.8 10*3/uL (ref 4.0–10.5)
nRBC: 0 % (ref 0.0–0.2)

## 2023-01-28 LAB — URINALYSIS, ROUTINE W REFLEX MICROSCOPIC
Bacteria, UA: NONE SEEN
Bilirubin Urine: NEGATIVE
Glucose, UA: NEGATIVE mg/dL
Hgb urine dipstick: NEGATIVE
Ketones, ur: 5 mg/dL — AB
Nitrite: NEGATIVE
Protein, ur: 100 mg/dL — AB
Specific Gravity, Urine: 1.031 — ABNORMAL HIGH (ref 1.005–1.030)
pH: 5 (ref 5.0–8.0)

## 2023-01-28 LAB — COMPREHENSIVE METABOLIC PANEL
ALT: 22 U/L (ref 0–44)
AST: 18 U/L (ref 15–41)
Albumin: 4.5 g/dL (ref 3.5–5.0)
Alkaline Phosphatase: 47 U/L (ref 38–126)
Anion gap: 11 (ref 5–15)
BUN: 12 mg/dL (ref 6–20)
CO2: 23 mmol/L (ref 22–32)
Calcium: 9.8 mg/dL (ref 8.9–10.3)
Chloride: 99 mmol/L (ref 98–111)
Creatinine, Ser: 0.86 mg/dL (ref 0.44–1.00)
GFR, Estimated: >60 mL/min (ref >60)
Glucose, Bld: 131 mg/dL — ABNORMAL HIGH (ref 70–99)
Potassium: 3.4 mmol/L — ABNORMAL LOW (ref 3.5–5.1)
Sodium: 133 mmol/L — ABNORMAL LOW (ref 135–145)
Total Bilirubin: 0.9 mg/dL (ref 0.3–1.2)
Total Protein: 8.8 g/dL — ABNORMAL HIGH (ref 6.5–8.1)

## 2023-01-28 LAB — LIPASE, BLOOD: Lipase: 23 U/L (ref 11–51)

## 2023-01-28 LAB — HCG, QUANTITATIVE, PREGNANCY: hCG, Beta Chain, Quant, S: 45899 m[IU]/mL — ABNORMAL HIGH (ref <5)

## 2023-01-28 MED ORDER — ONDANSETRON 4 MG PO TBDP: 4.0000 mg | ORAL_TABLET | Freq: Three times a day (TID) | ORAL | 0 refills | Status: DC | PRN

## 2023-01-28 MED ORDER — PYRIDOXINE HCL 100 MG/ML IJ SOLN
100.0000 mg | Freq: Every day | INTRAMUSCULAR | Status: DC
  Administered 2023-01-28: 100 mg via INTRAVENOUS
  Filled 2023-01-28 (×2): qty 1

## 2023-01-28 MED ORDER — SCOPOLAMINE 1 MG/3DAYS TD PT72: 1.0000 | MEDICATED_PATCH | TRANSDERMAL | 12 refills | Status: DC

## 2023-01-28 MED ORDER — PROCHLORPERAZINE EDISYLATE 10 MG/2ML IJ SOLN
10.0000 mg | Freq: Once | INTRAMUSCULAR | Status: AC
  Administered 2023-01-28: 10 mg via INTRAVENOUS
  Filled 2023-01-28: qty 2

## 2023-01-28 MED ORDER — LACTATED RINGERS IV BOLUS
1000.0000 mL | Freq: Once | INTRAVENOUS | Status: AC
  Administered 2023-01-28: 1000 mL via INTRAVENOUS

## 2023-01-28 MED ORDER — DIPHENHYDRAMINE HCL 50 MG/ML IJ SOLN
25.0000 mg | Freq: Once | INTRAMUSCULAR | Status: AC
  Administered 2023-01-28: 25 mg via INTRAVENOUS
  Filled 2023-01-28: qty 1

## 2023-01-28 NOTE — ED Notes (Signed)
Pt to ultrasound at this time.
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
## 2023-01-28 NOTE — ED Notes (Signed)
Snacks and drink given to pt for po trial.

## 2023-01-28 NOTE — ED Triage Notes (Signed)
Pt c/o vomiting since Tues. Pt states she found out she was pregnant that day but medication prescription does not help. Pt vomiting 6 times in 24 hrs, unable to keep anything down.

## 2023-01-30 ENCOUNTER — Encounter: Payer: Self-pay | Admitting: Adult Health

## 2023-01-30 ENCOUNTER — Encounter (HOSPITAL_COMMUNITY): Payer: Self-pay | Admitting: Obstetrics and Gynecology

## 2023-01-30 ENCOUNTER — Inpatient Hospital Stay (HOSPITAL_COMMUNITY)
Admission: AD | Admit: 2023-01-30 | Discharge: 2023-01-30 | Disposition: A | Discharge status: Home / Self Care | Payer: Medicaid Other | Attending: Obstetrics and Gynecology | Admitting: Obstetrics and Gynecology

## 2023-01-30 ENCOUNTER — Ambulatory Visit (INDEPENDENT_AMBULATORY_CARE_PROVIDER_SITE_OTHER): Payer: Medicaid Other | Admitting: Adult Health

## 2023-01-30 VITALS — BP 134/101 | HR 103 | Ht 61.0 in | Wt 141.0 lb

## 2023-01-30 DIAGNOSIS — Z8616 Personal history of COVID-19: Secondary | ICD-10-CM | POA: Diagnosis not present

## 2023-01-30 DIAGNOSIS — O210 Mild hyperemesis gravidarum: Secondary | ICD-10-CM

## 2023-01-30 DIAGNOSIS — O09521 Supervision of elderly multigravida, first trimester: Secondary | ICD-10-CM | POA: Insufficient documentation

## 2023-01-30 DIAGNOSIS — O26891 Other specified pregnancy related conditions, first trimester: Secondary | ICD-10-CM | POA: Diagnosis present

## 2023-01-30 DIAGNOSIS — Z3A01 Less than 8 weeks gestation of pregnancy: Secondary | ICD-10-CM

## 2023-01-30 DIAGNOSIS — R079 Chest pain, unspecified: Secondary | ICD-10-CM | POA: Diagnosis not present

## 2023-01-30 LAB — URINALYSIS, ROUTINE W REFLEX MICROSCOPIC
Bilirubin Urine: NEGATIVE
Glucose, UA: NEGATIVE mg/dL
Hgb urine dipstick: NEGATIVE
Ketones, ur: 5 mg/dL — AB
Leukocytes,Ua: NEGATIVE
Nitrite: NEGATIVE
Protein, ur: 30 mg/dL — AB
Specific Gravity, Urine: 1.031 — ABNORMAL HIGH (ref 1.005–1.030)
pH: 6 (ref 5.0–8.0)

## 2023-01-30 MED ORDER — PROCHLORPERAZINE MALEATE 10 MG PO TABS: 10.0000 mg | ORAL_TABLET | Freq: Two times a day (BID) | ORAL | 0 refills | Status: DC | PRN

## 2023-01-30 MED ORDER — LACTATED RINGERS IV SOLN
Freq: Once | INTRAVENOUS | Status: AC
  Filled 2023-01-30: qty 1000

## 2023-01-30 MED ORDER — SCOPOLAMINE 1 MG/3DAYS TD PT72
1.0000 | MEDICATED_PATCH | Freq: Once | TRANSDERMAL | Status: DC
  Administered 2023-01-30: 1.5 mg via TRANSDERMAL
  Filled 2023-01-30: qty 1

## 2023-01-30 MED ORDER — SODIUM CHLORIDE 0.9 % IV SOLN
8.0000 mg | Freq: Once | INTRAVENOUS | Status: AC
  Administered 2023-01-30: 8 mg via INTRAVENOUS
  Filled 2023-01-30: qty 4

## 2023-01-30 MED ORDER — FAMOTIDINE IN NACL 20-0.9 MG/50ML-% IV SOLN
20.0000 mg | Freq: Once | INTRAVENOUS | Status: AC
  Administered 2023-01-30: 20 mg via INTRAVENOUS
  Filled 2023-01-30: qty 50

## 2023-01-30 MED ORDER — DOXYLAMINE-PYRIDOXINE 10-10 MG PO TBEC: 1.0000 | DELAYED_RELEASE_TABLET | Freq: Three times a day (TID) | ORAL | 0 refills | Status: DC | PRN

## 2023-01-30 MED ORDER — SCOPOLAMINE 1 MG/3DAYS TD PT72: 1.0000 | MEDICATED_PATCH | TRANSDERMAL | 6 refills | Status: DC

## 2023-01-30 MED ORDER — PROMETHAZINE HCL 25 MG/ML IJ SOLN
25.0000 mg | Freq: Once | INTRAVENOUS | Status: AC
  Administered 2023-01-30: 25 mg via INTRAVENOUS
  Filled 2023-01-30: qty 1

## 2023-01-30 NOTE — Progress Notes (Signed)
    SUBJECTIVE:   CHIEF COMPLAINT / HPI:   Hyperemesis in early pregnancy  CC of continuous vomiting past week when she found out she was pregnant.  Was seen in ED on 5/28 and 6/02 for the same.  On 6/02, US showed IUP [redacted]w[redacted]d. She was treated with IV fluids, compazine, benadryl, and pyridoxine and discharged with prescriptions for zofran and scopolamine patch. Scopolamine patches were not in stock and she hasn't been able to keep zofran down.  Last ate apple sauce and crackers in ED. Hasn't been able to keep anything down since.  Admits to decreased UOP and urine is very dark and strong smelling. Admits to light headedness upon standing and feeling generally fatigued.   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 Respiratory: CTAB, normal effort, No wheezes, rales or rhonchi Abdomen: Mild diffuse tenderness to palpation, soft, non distended, dry heaving during exam Skin: warm and dry, no rashes noted Neuro: alert, no obvious focal deficits Psych: Normal affect and mood  ASSESSMENT/PLAN:   Hyperemesis gravidarum Pt is tachycardic, has decreased UOP, poor PO intake and continuous vomiting for past week as she is unable to keep anti-nausea medications down. Shared decision making used and pt will go to [facility] for evaluation and would likely benefit from IV fluids for dehydration.  -Scopolamine patches prescribed to [facility] who has them in stock  -[facility] provider contacted and aware pt is coming      Dr. Erick Alley, DO [facility]

## 2023-01-30 NOTE — MAU Note (Signed)
Carol Edwards is a 42 y.o. at [redacted]w[redacted]d here in MAU reporting: she was sent by OB to receive IVF's for N/V.  Reports unable to keep anything down for 2 days.  Denies VB.  Reports does have sharp constant pain in chest. LMP: NA Onset of complaint: 2 days Pain score: 5 Vitals:   01/30/23 1652  BP: (!) 140/109  Pulse: (!) 124  Resp: 19  Temp: 99.3 F (37.4 C)  SpO2: 100%     FHT:NA Lab orders placed from triage:   UA

## 2023-01-30 NOTE — Discharge Instructions (Signed)
I have changed a few of your medication's dosing or switched out medications for another. Please take the time to see what you should be taking and how they are now being dosed to avoid medication errors.

## 2023-01-30 NOTE — Assessment & Plan Note (Signed)
Pt is tachycardic, has decreased UOP, poor PO intake and continuous vomiting for past week as she is unable to keep anti-nausea medications down. Shared decision making used and pt will go to MAU for evaluation and would likely benefit from IV fluids for dehydration.  -[facility] who has [facility]  -MAU provider contacted and aware pt is coming
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
## 2023-01-30 NOTE — Progress Notes (Addendum)
  Subjective:     Patient ID: Carol Edwards, female   DOB: 21-Jun-1981, 42 y.o.   MRN: [id number]  HPI Carol Edwards is a 42 year old black female, with XL,[id number], about [redacted] weeks pregnant, having sever nausea and vomiting, she was seen in ER several times, last one 01/28/23 and was 6+5 weeks by [place]. She has not been able to find transderm scop patch, but has taken diglegis and Reglan and is still vomiting, can pee small amount.   Review of Systems +nausea and vomiting Reviewed past medical,surgical, social and family history. Reviewed medications and allergies.     Objective:   Physical Exam BP (!) 134/101 (BP Location: Left Arm, Patient Position: Sitting, Cuff Size: Normal)   Pulse (!) 103   Ht 5\' 1"  (1.549 m)   Wt 141 lb (64 kg)   LMP 12/26/2022 (Approximate)   BMI 26.64 kg/m  Skin warm and dry. Neck: mid line trachea, normal thyroid, good ROM, no lymphadenopathy noted. Lungs: clear to ausculation bilaterally. Cardiovascular: regular rate and rhythm. Looks stressed.  Abdomen is soft, but tender from vomiting, per Dr Yetta Barre.   Upstream - 01/30/23 1355       Pregnancy Intention Screening   Does the patient want to become pregnant in the next year? N/A    Does the patient's partner want to become pregnant in the next year? N/A    Would the patient like to discuss contraceptive options today? N/A      Contraception Wrap Up   Current Method Pregnant/Seeking Pregnancy    End Method Pregnant/Seeking Pregnancy    Contraception Counseling Provided No                Assessment:     1. Hyperemesis gravidarum To MAU for IV fluids Dr Yetta Barre called and spoke with Dr Alvester Morin [facility] ordered this encounter  Medications   scopolamine (TRANSDERM-SCOP) 1 MG/3DAYS    Sig: Place 1 patch (1.5 mg total) onto the skin every 3 (three) days.    Dispense:  10 patch    Refill:  6    Order Specific Question:   Supervising Provider    Answer:   Despina Hidden, LUTHER H [[id number]]    2. Multigravida of  advanced maternal age in first trimester     Plan:     Follow up for new OB ASAP OB handout given

## 2023-02-06 ENCOUNTER — Encounter: Payer: Medicaid Other | Admitting: *Deleted

## 2023-02-06 ENCOUNTER — Encounter: Payer: Medicaid Other | Admitting: Women's Health

## 2023-02-07 ENCOUNTER — Other Ambulatory Visit: Payer: Self-pay

## 2023-02-07 ENCOUNTER — Encounter (HOSPITAL_COMMUNITY): Payer: Self-pay

## 2023-02-07 ENCOUNTER — Inpatient Hospital Stay (HOSPITAL_COMMUNITY)
Admission: EM | Admit: 2023-02-07 | Discharge: 2023-02-08 | Disposition: A | Discharge status: Home / Self Care | Payer: Medicaid Other | Attending: Obstetrics and Gynecology | Admitting: Obstetrics and Gynecology

## 2023-02-07 DIAGNOSIS — O211 Hyperemesis gravidarum with metabolic disturbance: Secondary | ICD-10-CM | POA: Insufficient documentation

## 2023-02-07 DIAGNOSIS — O99611 Diseases of the digestive system complicating pregnancy, first trimester: Secondary | ICD-10-CM | POA: Insufficient documentation

## 2023-02-07 DIAGNOSIS — O09521 Supervision of elderly multigravida, first trimester: Secondary | ICD-10-CM | POA: Diagnosis not present

## 2023-02-07 DIAGNOSIS — O10911 Unspecified pre-existing hypertension complicating pregnancy, first trimester: Secondary | ICD-10-CM | POA: Diagnosis not present

## 2023-02-07 DIAGNOSIS — R030 Elevated blood-pressure reading, without diagnosis of hypertension: Secondary | ICD-10-CM | POA: Insufficient documentation

## 2023-02-07 DIAGNOSIS — E876 Hypokalemia: Secondary | ICD-10-CM

## 2023-02-07 DIAGNOSIS — O26891 Other specified pregnancy related conditions, first trimester: Secondary | ICD-10-CM | POA: Insufficient documentation

## 2023-02-07 DIAGNOSIS — K219 Gastro-esophageal reflux disease without esophagitis: Secondary | ICD-10-CM | POA: Insufficient documentation

## 2023-02-07 DIAGNOSIS — O219 Vomiting of pregnancy, unspecified: Secondary | ICD-10-CM

## 2023-02-07 DIAGNOSIS — Z8616 Personal history of COVID-19: Secondary | ICD-10-CM | POA: Insufficient documentation

## 2023-02-07 DIAGNOSIS — Z3A08 8 weeks gestation of pregnancy: Secondary | ICD-10-CM | POA: Insufficient documentation

## 2023-02-07 DIAGNOSIS — Z87891 Personal history of nicotine dependence: Secondary | ICD-10-CM | POA: Diagnosis not present

## 2023-02-07 LAB — CBC
HCT: 40.4 % (ref 36.0–46.0)
Hemoglobin: 13.9 g/dL (ref 12.0–15.0)
MCH: 30.8 pg (ref 26.0–34.0)
MCHC: 34.4 g/dL (ref 30.0–36.0)
MCV: 89.6 fL (ref 80.0–100.0)
Platelets: 421 10*3/uL — ABNORMAL HIGH (ref 150–400)
RBC: 4.51 MIL/uL (ref 3.87–5.11)
RDW: 12.2 % (ref 11.5–15.5)
WBC: 8.4 10*3/uL (ref 4.0–10.5)
nRBC: 0 % (ref 0.0–0.2)

## 2023-02-07 LAB — COMPREHENSIVE METABOLIC PANEL
ALT: 127 U/L — ABNORMAL HIGH (ref 0–44)
AST: 73 U/L — ABNORMAL HIGH (ref 15–41)
Albumin: 4.2 g/dL (ref 3.5–5.0)
Alkaline Phosphatase: 53 U/L (ref 38–126)
Anion gap: 14 (ref 5–15)
BUN: 12 mg/dL (ref 6–20)
CO2: 22 mmol/L (ref 22–32)
Calcium: 9.5 mg/dL (ref 8.9–10.3)
Chloride: 97 mmol/L — ABNORMAL LOW (ref 98–111)
Creatinine, Ser: 0.91 mg/dL (ref 0.44–1.00)
GFR, Estimated: >60 mL/min (ref >60)
Glucose, Bld: 119 mg/dL — ABNORMAL HIGH (ref 70–99)
Potassium: 2.8 mmol/L — ABNORMAL LOW (ref 3.5–5.1)
Sodium: 133 mmol/L — ABNORMAL LOW (ref 135–145)
Total Bilirubin: 1 mg/dL (ref 0.3–1.2)
Total Protein: 8.7 g/dL — ABNORMAL HIGH (ref 6.5–8.1)

## 2023-02-07 LAB — LIPASE, BLOOD: Lipase: 25 U/L (ref 11–51)

## 2023-02-07 MED ORDER — POTASSIUM CHLORIDE 10 MEQ/100ML IV SOLN
10.0000 meq | INTRAVENOUS | Status: AC
  Administered 2023-02-07: 10 meq via INTRAVENOUS
  Filled 2023-02-07 (×2): qty 100

## 2023-02-07 MED ORDER — SODIUM CHLORIDE 0.9 % IV BOLUS
1000.0000 mL | Freq: Once | INTRAVENOUS | Status: AC
  Administered 2023-02-07: 1000 mL via INTRAVENOUS

## 2023-02-07 MED ORDER — SODIUM CHLORIDE 0.9 % IV SOLN: 25.0000 mg | Freq: Once | INTRAVENOUS | Status: DC

## 2023-02-07 MED ORDER — DIPHENHYDRAMINE HCL 50 MG/ML IJ SOLN
25.0000 mg | Freq: Once | INTRAMUSCULAR | Status: AC
  Administered 2023-02-07: 25 mg via INTRAVENOUS
  Filled 2023-02-07: qty 1

## 2023-02-07 MED ORDER — POTASSIUM CHLORIDE 10 MEQ/100ML IV SOLN
10.0000 meq | Freq: Once | INTRAVENOUS | Status: AC
  Administered 2023-02-07: 10 meq via INTRAVENOUS
  Filled 2023-02-07: qty 100

## 2023-02-07 MED ORDER — PROCHLORPERAZINE EDISYLATE 10 MG/2ML IJ SOLN
10.0000 mg | Freq: Once | INTRAMUSCULAR | Status: AC
  Administered 2023-02-07: 10 mg via INTRAVENOUS
  Filled 2023-02-07: qty 2

## 2023-02-07 MED ORDER — SODIUM CHLORIDE 0.9 % IV SOLN
25.0000 mg | Freq: Once | INTRAVENOUS | Status: AC
  Administered 2023-02-07: 25 mg via INTRAVENOUS
  Filled 2023-02-07: qty 25

## 2023-02-07 MED ORDER — POTASSIUM CHLORIDE 10 MEQ/100ML IV SOLN
10.0000 meq | INTRAVENOUS | Status: DC
  Administered 2023-02-07 (×2): 10 meq via INTRAVENOUS
  Filled 2023-02-07 (×2): qty 100

## 2023-02-07 MED ORDER — LACTATED RINGERS IV BOLUS
1000.0000 mL | Freq: Once | INTRAVENOUS | Status: AC
  Administered 2023-02-07: 1000 mL via INTRAVENOUS

## 2023-02-07 NOTE — ED Notes (Signed)
[facility] present to transport pt 
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
## 2023-02-07 NOTE — ED Notes (Signed)
Attempted x 2 for IV access without success. Another nurse to assess.  °

## 2023-02-07 NOTE — ED Triage Notes (Signed)
Pt reports she has not been able to keep anything down in 2 weeks and she is [redacted] weeks pregnant.  Reports obgyn gave her diclegis and a scopolamine patch but it is not working.

## 2023-02-07 NOTE — MAU Note (Signed)
Carol Edwards is a 42 y.o. at [redacted]w[redacted]d here in MAU reporting: [facility] arrived with patient, just finished LR bolus and Potassium run. Continues to have nausea.    Vitals:   02/07/23 1945 02/07/23 2105  BP:  (!) 144/95  Pulse: 95 99  Resp: 19 20  Temp:  98.7 F (37.1 C)  SpO2: 100%

## 2023-02-07 NOTE — ED Notes (Signed)
Pt placed on telemetry- K 2.8 and receiving K+ runs.

## 2023-02-07 NOTE — ED Provider Notes (Signed)
[facility] EMERGENCY DEPARTMENT AT [facility] Provider Note   CSN: [id number] Arrival date & time: 02/07/23  1527     History  Chief Complaint  Patient presents with   Hyperemesis Gravidarum    Carol Edwards is a 42 y.o. female with past medical history significant for hypertension, tobacco abuse, hyperemesis gravidarum who presents with concern for ongoing nausea, vomiting, inability to keep food down for at least 2 weeks.  Patient is [redacted] weeks pregnant.  She was recently seen by her OB/GYN, she was referred to MAU.  Patient now with a protracted list of medications to help with her nausea including scopolamine patches, Diclegis, Zofran, and Compazine.  Patient reports that she has tried all of her oral medications multiple times, and has not had any relief of her emesis.  Patient reports that it has been at least a week and a half since she can remember keeping a meal down.  She endorses no significant abdominal pain, she endorses no drug use.  She reports that she feels weak, dehydrated.  HPI     Home Medications Prior to Admission medications   Medication Sig Start Date End Date Taking? Authorizing Provider  Doxylamine-Pyridoxine 10-10 MG TBEC Take 1 tablet by mouth every 8 (eight) hours as needed for up to 30 doses. Take 2 tablets at bedtime. If symptoms persist, add 1 tab in the AM starting on day 3. If symptoms persist, add 1 tab in the PM starting day 4. 01/30/23  Yes Arita Miss, Rolitta, CNM  ondansetron (ZOFRAN-ODT) 4 MG disintegrating tablet Take 1 tablet (4 mg total) by mouth every 8 (eight) hours as needed for nausea or vomiting. 01/28/23  Yes Kommor, Madison, MD  prochlorperazine (COMPAZINE) 10 MG tablet Take 1 tablet (10 mg total) by mouth 2 (two) times daily as needed for nausea or vomiting. 01/30/23  Yes Raelyn Mora, CNM  scopolamine (TRANSDERM-SCOP) 1 MG/3DAYS Place 1 patch (1.5 mg total) onto the skin every 3 (three) days. 01/30/23  Yes Adline Potter, NP   acetaminophen (TYLENOL) 500 MG tablet Take 500 mg by mouth every 6 (six) hours as needed for mild pain or moderate pain.    [provider]      Allergies    Patient has no known allergies.    Review of Systems   Review of Systems  All other systems reviewed and are negative.   Physical Exam Updated Vital Signs BP 132/85 (BP Location: Right Arm)   Pulse 97   Temp 98.7 F (37.1 C) (Oral)   Resp 20   Ht 5\' 1"  (1.549 m)   Wt 63 kg   LMP 12/26/2022 (Approximate)   SpO2 100%   BMI 26.26 kg/m  Physical Exam Vitals and nursing note reviewed.  Constitutional:      General: She is not in acute distress.    Appearance: Normal appearance. She is ill-appearing.     Comments: Actively retching, vomiting during my initial evaluation  HENT:     Head: Normocephalic and atraumatic.  Eyes:     General:        Right eye: No discharge.        Left eye: No discharge.  Cardiovascular:     Rate and Rhythm: Regular rhythm. Tachycardia present.     Heart sounds: No murmur heard.    No friction rub. No gallop.     Comments: Mild tachycardia with normal rhythm Pulmonary:     Effort: Pulmonary effort is normal.  Breath sounds: Normal breath sounds.  Abdominal:     General: Bowel sounds are normal.     Palpations: Abdomen is soft.     Comments: No significant tenderness to palpation of the epigastric region  Skin:    General: Skin is warm and dry.     Capillary Refill: Capillary refill takes less than 2 seconds.  Neurological:     Mental Status: She is alert and oriented to person, place, and time.  Psychiatric:        Mood and Affect: Mood normal.        Behavior: Behavior normal.     ED Results / Procedures / Treatments   Labs (all labs ordered are listed, but only abnormal results are displayed) Labs Reviewed  COMPREHENSIVE METABOLIC PANEL - Abnormal; Notable for the following components:      Result Value   Sodium 133 (*)    Potassium 2.8 (*)    Chloride 97 (*)     Glucose, Bld 119 (*)    Total Protein 8.7 (*)    AST 73 (*)    ALT 127 (*)    All other components within normal limits  CBC - Abnormal; Notable for the following components:   Platelets 421 (*)    All other components within normal limits  LIPASE, BLOOD    EKG None  Radiology No results found.  Procedures Procedures    Medications Ordered in ED Medications  sodium chloride 0.9 % bolus 1,000 mL (0 mLs Intravenous Stopped 02/07/23 2054)  prochlorperazine (COMPAZINE) injection 10 mg (10 mg Intravenous Given 02/07/23 1801)  diphenhydrAMINE (BENADRYL) injection 25 mg (25 mg Intravenous Given 02/07/23 1758)  potassium chloride 10 mEq in 100 mL IVPB (0 mEq Intravenous Duplicate 02/07/23 2307)  lactated ringers bolus 1,000 mL (0 mLs Intravenous Stopping previously hung infusion 02/08/23 0050)  promethazine (PHENERGAN) 25 mg in sodium chloride 0.9 % 50 mL IVPB (0 mg Intravenous Stopped 02/07/23 2150)  potassium chloride 10 mEq in 100 mL IVPB (0 mEq Intravenous Stopping previously hung infusion 02/08/23 0015)    ED Course/ Medical Decision Making/ A&P                             Medical Decision Making Amount and/or Complexity of Data Reviewed Labs: ordered.  Risk Prescription drug management.   This patient is a 42 y.o. female who presents to the ED for concern of ongoing hyperemesis, this involves an extensive number of treatment options, and is a complaint that carries with it a high risk of complications and morbidity. The emergent differential diagnosis prior to evaluation includes, but is not limited to, hyperemesis gravidarum, electrolyte abnormality, dehydration, or other intra-abdominal complication affecting pregnancy. This is not an exhaustive differential.   Past Medical History / Co-morbidities / Social History: Hypertension, tobacco abuse, current pregnancy of around 8 weeks  Additional history: Chart reviewed. Pertinent results include: Reviewed relevant lab work,  imaging, notably patient with recent visits both to the emergency department and the MAU regarding her hyperemesis, she is taking orally Compazine, Zofran, Diclegis, and scopolamine patches, reportedly with no relief  Physical Exam: Physical exam performed. The pertinent findings include: Patient has dry mucous membranes on exam, she has mild tenderness palpation in the epigastric region without rebound, rigidity, guarding.  She is actively retching during initial evaluation.  Mildly tachycardic with normal rhythm.  Lab Tests: I ordered, and personally interpreted labs.  The pertinent results include: CBC  overall unremarkable, mildly elevated platelets may be secondary to hemoconcentration, normal lipase.  CMP notable for mild hyponatremia, sodium 133, moderate hypokalemia, potassium 2.8.  Patient not tolerating potassium by mouth, will administer IV.  Mild elevation of her AST and ALT is likely secondary to dehydration.   Medications: I ordered medication including fluid bolus, potassium for hypokalemia, dehydration, Compazine, Benadryl for nausea. Reevaluation of the patient after these medicines showed that the patient improved.  Patient no longer actively retching after IV Compazine, however as we have exhausted the options for her oral medications and she is coming in dehydrated, hypokalemic, with responding only to IV medications I do think that she would benefit from transfer to the MAU for further evaluation and admission.  Consultations Obtained: I requested consultation with the OB/GYN, spoke with Dr. Jolayne Panther,  and discussed lab and imaging findings as well as pertinent plan - they recommend: Admission to the MAU for hyperemesis gravidarum refractory to outpatient medication, With hypokalemia, dehydration.   Disposition: After consideration of the diagnostic results and the patients response to treatment, I feel that patient is stable for transfer to [facility] with plan as above.   I discussed  this case with my attending physician Dr. Deretha Emory who cosigned this note including patient's presenting symptoms, physical exam, and planned diagnostics and interventions. Attending physician stated agreement with plan or made changes to plan which were implemented.     Final Clinical Impression(s) / ED Diagnoses Final diagnoses:  Nausea and vomiting during pregnancy prior to [redacted] weeks gestation  Hypokalemia  [redacted] weeks gestation of pregnancy    Rx / DC Orders ED Discharge Orders          Ordered    Discharge patient        02/08/23 0022              [place] 02/08/23 1610    Vanetta Mulders, MD 02/12/23 1209

## 2023-02-08 DIAGNOSIS — O219 Vomiting of pregnancy, unspecified: Secondary | ICD-10-CM | POA: Diagnosis not present

## 2023-02-08 DIAGNOSIS — Z3A08 8 weeks gestation of pregnancy: Secondary | ICD-10-CM

## 2023-02-08 NOTE — Discharge Instructions (Signed)
  [facility] Prov[facility]          [facility]  [address], [place], [place] [address]  [phone]  [facility]  [address], [place], [place] [address]  [phone]  [facility]  [address], [place], [place] [address]  [phone]  [facility] [address], [place], [place] [address] [phone]  [facility] [place]  [address] [place] [address], [place], [place] [address]  [phone]  [facility]  [address] floor), [place], [place] [address]  [phone]  [facility]  [address], [place], [place] [address]  [phone]  [facility]  [address], [place], [place] [address]  [phone]  [facility]  [address], [place], [place] [address]  [phone]  [facility]  [address], [place], [place] [address]  [phone]  [facility]  [address], [place], [place] [address]  [phone]  [facility]  [address], [place], [place] [address]  [phone]  [facility]   [address], [place], [place] [address]  [phone]  [facility]  [address], [place], [place] [address]   [phone]  [facility] [address], , [place] [address] [phone]  [facility]  [address], , [place] [address]  [phone]         

## 2023-02-19 ENCOUNTER — Other Ambulatory Visit: Payer: Medicaid Other

## 2023-02-26 HISTORY — PX: INDUCED ABORTION: SHX677

## 2023-03-07 ENCOUNTER — Encounter: Payer: Medicaid Other | Admitting: Advanced Practice Midwife

## 2023-03-07 ENCOUNTER — Other Ambulatory Visit: Payer: Medicaid Other

## 2023-03-07 ENCOUNTER — Encounter: Payer: Medicaid Other | Admitting: *Deleted

## 2023-03-08 ENCOUNTER — Encounter: Payer: Self-pay | Admitting: Adult Health

## 2023-03-08 ENCOUNTER — Ambulatory Visit: Payer: MEDICAID | Admitting: Adult Health

## 2023-03-08 VITALS — BP 176/105 | HR 72 | Ht 61.0 in | Wt 152.0 lb

## 2023-03-08 DIAGNOSIS — I10 Essential (primary) hypertension: Secondary | ICD-10-CM

## 2023-03-08 DIAGNOSIS — Z9889 Other specified postprocedural states: Secondary | ICD-10-CM | POA: Insufficient documentation

## 2023-03-08 DIAGNOSIS — Z3201 Encounter for pregnancy test, result positive: Secondary | ICD-10-CM | POA: Diagnosis not present

## 2023-03-08 LAB — POCT URINE PREGNANCY: Preg Test, Ur: POSITIVE — AB

## 2023-03-08 MED ORDER — HYDROCHLOROTHIAZIDE 12.5 MG PO CAPS: 12.5000 mg | ORAL_CAPSULE | Freq: Every day | ORAL | 3 refills | Status: DC

## 2023-03-08 NOTE — Progress Notes (Signed)
  Subjective:     Patient ID: Carol Edwards, female   DOB: 1980/09/12, 42 y.o.   MRN: [id number]  HPI Carol Edwards is a 42 year old black female, with SO, [id number] in for follow up on elective abortion, about 2 weeks ago, at [facility]. She had severe hyperemesis, no nausea and vomiting, since about 1-2 days after termination. She wants to get on depo.  Review of Systems Has some swelling in feet at times No nausea or vomiting now Reviewed past medical,surgical, social and family history. Reviewed medications and allergies.     Objective:   Physical Exam BP (!) 176/105 (BP Location: Right Arm, Patient Position: Sitting, Cuff Size: Normal)   Pulse 72   Ht 5\' 1"  (1.549 m)   Wt 152 lb (68.9 kg)   LMP 12/26/2022 (Approximate)   Breastfeeding No   BMI 28.72 kg/m  UPT is +.  Skin warm and dry. Lungs: clear to ausculation bilaterally. Cardiovascular: regular rate and rhythm.       Upstream - 03/08/23 1016       Pregnancy Intention Screening   Does the patient want to become pregnant in the next year? No    Does the patient's partner want to become pregnant in the next year? No    Would the patient like to discuss contraceptive options today? Yes      Contraception Wrap Up   Current Method Abstinence    End Method Abstinence    Contraception Counseling Provided Yes             Assessment:     1. Pregnancy examination or test, positive result Will check QHCG today - POCT urine pregnancy - Beta hCG quant (ref lab)  2. History of elective abortion UPT is still + Will check QHCG  She is 0- and got rhogam she said  - Beta hCG quant (ref lab)  3. Hypertension, unspecified type Will rx Microzide 12.5 mg 1 daily  Decrease salt and sugar Will recheck BP in 2 weeks  Meds ordered this encounter  Medications   hydrochlorothiazide (MICROZIDE) 12.5 MG capsule    Sig: Take 1 capsule (12.5 mg total) by mouth daily.    Dispense:  30 capsule    Refill:  3    Order Specific  Question:   Supervising Provider    Answer:   Lazaro Arms [[id number]]       Plan:    No sex for now, will rx depo when [facility] negative  Follow up in 2 weeks for BP check and will do pap then

## 2023-03-09 ENCOUNTER — Telehealth: Payer: Self-pay | Admitting: *Deleted

## 2023-03-09 ENCOUNTER — Other Ambulatory Visit: Payer: Self-pay | Admitting: Adult Health

## 2023-03-09 DIAGNOSIS — Z9889 Other specified postprocedural states: Secondary | ICD-10-CM

## 2023-03-09 LAB — BETA HCG QUANT (REF LAB): hCG Quant: 237 m[IU]/mL

## 2023-03-09 NOTE — Telephone Encounter (Signed)
-----   Message from Cyril Mourning sent at 03/09/2023  9:20 AM EDT ----- Will you call and let her know [facility] was 237, will recheck 03/19/23. No sex and if dropped, will order depo and can get 03/22/23 when sees me.

## 2023-03-09 NOTE — Telephone Encounter (Signed)
Pt aware quant was 237. Needs to repeat on 03/19/23. Advised no sex & if dropped, can get Depo on 03/22/23. Pt voiced understanding. JSY

## 2023-03-21 LAB — BETA HCG QUANT (REF LAB): hCG Quant: 17 m[IU]/mL

## 2023-03-22 ENCOUNTER — Ambulatory Visit (INDEPENDENT_AMBULATORY_CARE_PROVIDER_SITE_OTHER): Payer: MEDICAID | Admitting: Adult Health

## 2023-03-22 ENCOUNTER — Other Ambulatory Visit (HOSPITAL_COMMUNITY)
Admission: RE | Admit: 2023-03-22 | Discharge: 2023-03-22 | Disposition: A | Discharge status: Home / Self Care | Payer: MEDICAID | Source: Ambulatory Visit | Attending: Adult Health | Admitting: Adult Health

## 2023-03-22 ENCOUNTER — Encounter: Payer: Self-pay | Admitting: Adult Health

## 2023-03-22 ENCOUNTER — Ambulatory Visit: Payer: MEDICAID | Admitting: Adult Health

## 2023-03-22 VITALS — BP 156/105 | HR 79 | Ht 61.0 in | Wt 148.0 lb

## 2023-03-22 DIAGNOSIS — I10 Essential (primary) hypertension: Secondary | ICD-10-CM

## 2023-03-22 DIAGNOSIS — Z9889 Other specified postprocedural states: Secondary | ICD-10-CM | POA: Diagnosis not present

## 2023-03-22 DIAGNOSIS — Z124 Encounter for screening for malignant neoplasm of cervix: Secondary | ICD-10-CM

## 2023-03-22 DIAGNOSIS — N898 Other specified noninflammatory disorders of vagina: Secondary | ICD-10-CM

## 2023-03-22 HISTORY — DX: Encounter for screening for malignant neoplasm of cervix: Z12.4

## 2023-03-22 MED ORDER — METRONIDAZOLE 500 MG PO TABS: 500.0000 mg | ORAL_TABLET | Freq: Two times a day (BID) | ORAL | 0 refills | Status: DC

## 2023-03-22 MED ORDER — DOXYCYCLINE HYCLATE 100 MG PO TABS: 100.0000 mg | ORAL_TABLET | Freq: Two times a day (BID) | ORAL | 0 refills | Status: DC

## 2023-03-22 MED ORDER — AMLODIPINE BESYLATE 5 MG PO TABS: 5.0000 mg | ORAL_TABLET | Freq: Every day | ORAL | 3 refills | Status: DC

## 2023-03-22 NOTE — Progress Notes (Signed)
  Subjective:     Patient ID: Carol Edwards, female   DOB: 06/27/1981, 42 y.o.   MRN: [id number]  HPI [name] is a 42 year old black female, with SO, [id number], back in follow on BP,  she stopped Microzide after 1 tablet, made her sick. Has stomach pain for 3 days, denies any nausea, vomiting, or diarrhea. Has not had sex, she has vaginal odor. She had elective abortion end of June first of July . QHCG was 17 03/20/23. She needs pap.    Review of Systems Has stomach pain x 3 days Has headache  Has vaginal odor Not having sex    Reviewed past medical,surgical, social and family history. Reviewed medications and allergies.  Objective:   Physical Exam BP (!) 156/105 (BP Location: Right Arm, Patient Position: Sitting, Cuff Size: Normal)   Pulse 79   Ht 5\' 1"  (1.549 m)   Wt 148 lb (67.1 kg)   Breastfeeding No   BMI 27.96 kg/m     Skin warm and dry. Lungs: clear to ausculation bilaterally. Cardiovascular: regular rate and rhythm.  .Pelvic: external genitalia is normal in appearance no lesions, vagina: tan watery discharge with odor,urethra has no lesions or masses noted, cervix:smooth and bulbous,no CMT,pap with GC/CHL and HR HPV genotyping performed, uterus: normal size, shape and contour, non tender, no masses felt, adnexa: no masses or tenderness noted. Bladder is non tender and no masses felt.  Fall risk is low  Upstream - 03/22/23 1420       Pregnancy Intention Screening   Does the patient want to become pregnant in the next year? No    Does the patient's partner want to become pregnant in the next year? No      Contraception Wrap Up   Current Method Abstinence            Examination chaperoned by Malachy Mood LPN  Assessment:     1. Routine Papanicolaou smear Pap sent Pap in 3 years if normal - Cytology - PAP( [place])  2. History of elective abortion  3. Hypertension, unspecified type BP still elevated Will try Norvasc 5 mg 1 daily   Meds ordered this  encounter  Medications   doxycycline (VIBRA-TABS) 100 MG tablet    Sig: Take 1 tablet (100 mg total) by mouth 2 (two) times daily.    Dispense:  28 tablet    Refill:  0    Order Specific Question:   Supervising Provider    Answer:   Duane Lope H [[id number]]   metroNIDAZOLE (FLAGYL) 500 MG tablet    Sig: Take 1 tablet (500 mg total) by mouth 2 (two) times daily.    Dispense:  14 tablet    Refill:  0    Order Specific Question:   Supervising Provider    Answer:   Despina Hidden, LUTHER H [[id number]]   amLODipine (NORVASC) 5 MG tablet    Sig: Take 1 tablet (5 mg total) by mouth daily.    Dispense:  30 tablet    Refill:  3    Order Specific Question:   Supervising Provider    Answer:   Despina Hidden, LUTHER H [[id number]]     4. Vaginal discharge Will rx doxycycline   5. Vaginal odor Will rx flagyl 500 mg 1 bid x 7 days      No sex or alcohol while taking meds  Plan:     Follow up in 2 weeks for ROS

## 2023-04-10 ENCOUNTER — Ambulatory Visit: Payer: MEDICAID | Admitting: Adult Health

## 2023-04-12 ENCOUNTER — Ambulatory Visit: Payer: MEDICAID | Admitting: Adult Health

## 2023-04-19 ENCOUNTER — Encounter: Payer: Self-pay | Admitting: Adult Health

## 2023-04-19 ENCOUNTER — Ambulatory Visit: Payer: MEDICAID | Admitting: Adult Health

## 2023-04-19 VITALS — BP 157/103 | HR 65 | Ht 61.0 in | Wt 154.5 lb

## 2023-04-19 DIAGNOSIS — Z30013 Encounter for initial prescription of injectable contraceptive: Secondary | ICD-10-CM

## 2023-04-19 DIAGNOSIS — Z3202 Encounter for pregnancy test, result negative: Secondary | ICD-10-CM

## 2023-04-19 DIAGNOSIS — I10 Essential (primary) hypertension: Secondary | ICD-10-CM | POA: Diagnosis not present

## 2023-04-19 DIAGNOSIS — N898 Other specified noninflammatory disorders of vagina: Secondary | ICD-10-CM

## 2023-04-19 LAB — POCT URINE PREGNANCY: Preg Test, Ur: NEGATIVE

## 2023-04-19 MED ORDER — AMLODIPINE BESYLATE 10 MG PO TABS: 10.0000 mg | ORAL_TABLET | Freq: Every day | ORAL | 6 refills | Status: DC

## 2023-04-19 MED ORDER — MEDROXYPROGESTERONE ACETATE 150 MG/ML IM SUSY
150.0000 mg | PREFILLED_SYRINGE | Freq: Once | INTRAMUSCULAR | Status: AC
Start: 2023-04-19 — End: 2023-04-19
  Administered 2023-04-19: 150 mg via INTRAMUSCULAR

## 2023-04-19 MED ORDER — MEDROXYPROGESTERONE ACETATE 150 MG/ML IM SUSP: 150.0000 mg | INTRAMUSCULAR | 4 refills | Status: AC

## 2023-04-19 NOTE — Progress Notes (Signed)
  Subjective:     Patient ID: Carol Edwards, female   DOB: 28-Nov-1980, 42 y.o.   MRN: [id number]  HPI Carol Edwards is a 42 year old black female,with SO, [id number] in for BP check taking norvasc 5 mg 1 daily and wants to get depo. She has vaginal discharge with odor and was treated 03/22/23 with flagyl and doxycycline.     Component Value Date/Time   DIAGPAP  03/22/2023 1410    - Negative for intraepithelial lesion or malignancy (NILM)   HPVHIGH Negative 03/22/2023 1410   ADEQPAP  03/22/2023 1410    Satisfactory for evaluation; transformation zone component ABSENT.    Review of Systems Denies any discharge or odor Has not had sex a while. Reviewed past medical,surgical, social and family history. Reviewed medications and allergies.     Objective:   Physical Exam BP (!) 157/103 (BP Location: Right Arm, Patient Position: Sitting, Cuff Size: Normal)   Pulse 65   Ht 5\' 1"  (1.549 m)   Wt 154 lb 8 oz (70.1 kg)   LMP 04/14/2023   Breastfeeding No   BMI 29.19 kg/m  UPT is negative Skin warm and dry.  Lungs: clear to ausculation bilaterally. Cardiovascular: regular rate and rhythm.   Upstream - 04/19/23 1353       Pregnancy Intention Screening   Does the patient want to become pregnant in the next year? No    Does the patient's partner want to become pregnant in the next year? No    Would the patient like to discuss contraceptive options today? Yes      Contraception Wrap Up   Current Method Abstinence    End Method Hormonal Injection    Contraception Counseling Provided Yes                Assessment:     1. Pregnancy examination or test, negative result - POCT urine pregnancy  2. Hypertension, unspecified type BP is still elevated will increase Norvasc to 10 mg 1 daily  Decrease salt and sugars  Review DASH diet Will recheck in 12 weeks   3. Encounter for initial prescription of injectable contraceptive Depo given in office for first dose and rx sent  Meds ordered this  encounter  Medications   amLODipine (NORVASC) 10 MG tablet    Sig: Take 1 tablet (10 mg total) by mouth daily.    Dispense:  30 tablet    Refill:  6    Order Specific Question:   Supervising Provider    Answer:   Duane Lope H [[id number]]   medroxyPROGESTERone (DEPO-PROVERA) 150 MG/ML injection    Sig: Inject 1 mL (150 mg total) into the muscle every 3 (three) months.    Dispense:  1 mL    Refill:  4    Order Specific Question:   Supervising Provider    Answer:   Duane Lope H [[id number]]   medroxyPROGESTERone Acetate SUSY 150 mg    - medroxyPROGESTERone Acetate SUSY 150 mg  4. Vaginal odor Resolved   5. Vaginal discharge Resolved     Plan:     Return in 12 weeks for BP check and ROS and depo

## 2023-07-12 ENCOUNTER — Ambulatory Visit: Payer: MEDICAID | Admitting: Adult Health

## 2023-07-19 ENCOUNTER — Encounter: Payer: Self-pay | Admitting: Adult Health

## 2023-07-19 ENCOUNTER — Ambulatory Visit: Payer: MEDICAID | Admitting: Adult Health

## 2023-07-19 VITALS — BP 143/99 | HR 85 | Ht 61.0 in | Wt 168.8 lb

## 2023-07-19 DIAGNOSIS — Z862 Personal history of diseases of the blood and blood-forming organs and certain disorders involving the immune mechanism: Secondary | ICD-10-CM | POA: Diagnosis not present

## 2023-07-19 DIAGNOSIS — I10 Essential (primary) hypertension: Secondary | ICD-10-CM

## 2023-07-19 DIAGNOSIS — Z3042 Encounter for surveillance of injectable contraceptive: Secondary | ICD-10-CM | POA: Diagnosis not present

## 2023-07-19 DIAGNOSIS — R635 Abnormal weight gain: Secondary | ICD-10-CM | POA: Diagnosis not present

## 2023-07-19 MED ORDER — MEDROXYPROGESTERONE ACETATE 150 MG/ML IM SUSY
150.0000 mg | PREFILLED_SYRINGE | Freq: Once | INTRAMUSCULAR | Status: AC
  Administered 2023-07-19: 150 mg via INTRAMUSCULAR

## 2023-07-19 NOTE — Progress Notes (Signed)
  Subjective:     Patient ID: Carol Edwards, female   DOB: 1981-03-05, 42 y.o.   MRN: [id number]  HPI Carol Edwards is a 42 year old black female, with SO, [id number] in for a BP check after increasing Norvasc to 10  mg 1 daily and for her depo injection. She has gained about 14 lbs since August.     Component Value Date/Time   DIAGPAP  03/22/2023 1410    - Negative for intraepithelial lesion or malignancy (NILM)   HPVHIGH Negative 03/22/2023 1410   ADEQPAP  03/22/2023 1410    Satisfactory for evaluation; transformation zone component ABSENT.    Review of Systems +weight gain Reviewed past medical,surgical, social and family history. Reviewed medications and allergies.     Objective:   Physical Exam BP (!) 143/99 (BP Location: Right Arm, Patient Position: Sitting, Cuff Size: Normal)   Pulse 85   Ht 5\' 1"  (1.549 m)   Wt 168 lb 12.8 oz (76.6 kg)   BMI 31.89 kg/m     Skin warm and dry.  Lungs: clear to ausculation bilaterally. Cardiovascular: regular rate and rhythm.   Upstream - 07/19/23 1547       Pregnancy Intention Screening   Does the patient want to become pregnant in the next year? No    Does the patient's partner want to become pregnant in the next year? No    Would the patient like to discuss contraceptive options today? No      Contraception Wrap Up   Current Method Hormonal Injection    End Method Hormonal Injection    Contraception Counseling Provided No             Assessment:     1. Depo-Provera contraceptive status Depo given in office today Return in 12 weeks for depo Gave handouts on endometrial ablation and tubal sterilization, if decides to stop depo  2. Hypertension, unspecified type Decrease salt Continue norvasc 10 mg 1 daily, has refills Recheck BP in 4 weeks  - Comprehensive metabolic panel  3. Weight gain Has gained 14 lbs since August Will check labs - Comprehensive metabolic panel - TSH + free T4  4. History of anemia Will check CBC - CBC      Plan:     Return in 4 weeks for BP and weight check

## 2023-07-20 LAB — CBC
Hematocrit: 39.8 % (ref 34.0–46.6)
Hemoglobin: 13.1 g/dL (ref 11.1–15.9)
MCH: 30.8 pg (ref 26.6–33.0)
MCHC: 32.9 g/dL (ref 31.5–35.7)
MCV: 93 fL (ref 79–97)
Platelets: 314 10*3/uL (ref 150–450)
RBC: 4.26 x10E6/uL (ref 3.77–5.28)
RDW: 13.1 % (ref 11.7–15.4)
WBC: 5.2 10*3/uL (ref 3.4–10.8)

## 2023-07-20 LAB — COMPREHENSIVE METABOLIC PANEL
ALT: 11 [IU]/L (ref 0–32)
AST: 13 [IU]/L (ref 0–40)
Albumin: 4.3 g/dL (ref 3.9–4.9)
Alkaline Phosphatase: 60 [IU]/L (ref 44–121)
BUN/Creatinine Ratio: 9 (ref 9–23)
BUN: 9 mg/dL (ref 6–24)
Bilirubin Total: <0.2 mg/dL (ref 0.0–1.2)
CO2: 22 mmol/L (ref 20–29)
Calcium: 9.4 mg/dL (ref 8.7–10.2)
Chloride: 106 mmol/L (ref 96–106)
Creatinine, Ser: 0.99 mg/dL (ref 0.57–1.00)
Globulin, Total: 3.1 g/dL (ref 1.5–4.5)
Glucose: 83 mg/dL (ref 70–99)
Potassium: 4.1 mmol/L (ref 3.5–5.2)
Sodium: 142 mmol/L (ref 134–144)
Total Protein: 7.4 g/dL (ref 6.0–8.5)
eGFR: 73 mL/min/{1.73_m2} (ref >59)

## 2023-07-20 LAB — TSH+FREE T4
Free T4: 1.23 ng/dL (ref 0.82–1.77)
TSH: 2.01 u[IU]/mL (ref 0.450–4.500)

## 2023-08-15 ENCOUNTER — Ambulatory Visit: Payer: MEDICAID | Admitting: Adult Health

## 2023-12-28 ENCOUNTER — Ambulatory Visit: Payer: MEDICAID

## 2023-12-28 ENCOUNTER — Ambulatory Visit: Payer: MEDICAID | Admitting: *Deleted

## 2023-12-28 DIAGNOSIS — Z3042 Encounter for surveillance of injectable contraceptive: Secondary | ICD-10-CM | POA: Diagnosis not present

## 2023-12-28 DIAGNOSIS — Z3202 Encounter for pregnancy test, result negative: Secondary | ICD-10-CM | POA: Diagnosis not present

## 2023-12-28 LAB — POCT URINE PREGNANCY: Preg Test, Ur: NEGATIVE

## 2023-12-28 MED ORDER — MEDROXYPROGESTERONE ACETATE 150 MG/ML IM SUSY
150.0000 mg | PREFILLED_SYRINGE | Freq: Once | INTRAMUSCULAR | Status: AC
Start: 2023-12-28 — End: 2023-12-28
  Administered 2023-12-28: 150 mg via INTRAMUSCULAR

## 2023-12-28 NOTE — Progress Notes (Signed)
   NURSE VISIT- INJECTION  SUBJECTIVE:  Carol Edwards is a 43 y.o. [phone] female here for a Depo Provera  for contraception/period management. She is a GYN patient.   OBJECTIVE:  LMP 12/17/2023   Appears well, in no apparent distress  Injection administered in: Left upper quad. gluteus  Meds ordered this encounter  Medications   medroxyPROGESTERone  Acetate SUSY 150 mg    ASSESSMENT: GYN patient Depo Provera  for contraception/period management PLAN: Follow-up: in 11-13 weeks for next Depo   Laverne Potter  12/28/2023 8:49 AM

## 2024-03-17 ENCOUNTER — Ambulatory Visit: Payer: MEDICAID | Admitting: *Deleted

## 2024-03-17 DIAGNOSIS — Z3042 Encounter for surveillance of injectable contraceptive: Secondary | ICD-10-CM

## 2024-03-17 MED ORDER — MEDROXYPROGESTERONE ACETATE 150 MG/ML IM SUSY
150.0000 mg | PREFILLED_SYRINGE | Freq: Once | INTRAMUSCULAR | Status: AC
  Administered 2024-03-17: 150 mg via INTRAMUSCULAR

## 2024-03-17 NOTE — Progress Notes (Signed)
   NURSE VISIT- INJECTION  SUBJECTIVE:  Carol Edwards is a 43 y.o. [phone] female here for a Depo Provera  for contraception/period management. She is a GYN patient.   OBJECTIVE:  There were no vitals taken for this visit.  Appears well, in no apparent distress  Injection administered in: Right upper quad. gluteus  Meds ordered this encounter  Medications   medroxyPROGESTERone  Acetate SUSY 150 mg    ASSESSMENT: GYN patient Depo Provera  for contraception/period management PLAN: Follow-up: in 11-13 weeks for next Depo   Rutherford Rover  03/17/2024 9:06 AM

## 2024-03-27 ENCOUNTER — Ambulatory Visit: Payer: MEDICAID | Admitting: Adult Health

## 2024-03-27 ENCOUNTER — Encounter: Payer: Self-pay | Admitting: Adult Health

## 2024-03-27 VITALS — BP 188/118 | HR 90 | Ht 61.0 in | Wt 179.0 lb

## 2024-03-27 DIAGNOSIS — I10 Essential (primary) hypertension: Secondary | ICD-10-CM | POA: Diagnosis not present

## 2024-03-27 DIAGNOSIS — R635 Abnormal weight gain: Secondary | ICD-10-CM | POA: Diagnosis not present

## 2024-03-27 MED ORDER — AMLODIPINE BESYLATE 10 MG PO TABS
10.0000 mg | ORAL_TABLET | Freq: Every day | ORAL | 6 refills | Status: AC
Start: 2024-03-27 — End: ?

## 2024-03-27 MED ORDER — HYDROCHLOROTHIAZIDE 12.5 MG PO CAPS: 12.5000 mg | ORAL_CAPSULE | Freq: Every day | ORAL | 6 refills | Status: AC

## 2024-03-27 NOTE — Progress Notes (Signed)
  Subjective:     Patient ID: Carol Edwards, female   DOB: [birth year], 43 y.o.   MRN: [id number]  HPI Carol Edwards is a 43 year old black female,with SO, [id number] in wanting to discuss weight gain, needs help losing weight.  She has had a headache at times and fingers swelling. She says she is taking BP meds.     Component Value Date/Time   DIAGPAP  03/22/2023 1410    - Negative for intraepithelial lesion or malignancy (NILM)   HPVHIGH Negative 03/22/2023 1410   ADEQPAP  03/22/2023 1410    Satisfactory for evaluation; transformation zone component ABSENT.    No current PCP  Review of Systems +weight gain +headache +swelling in fingers at times She denies vision changes or lightheadedness  Reviewed past medical,surgical, social and family history. Reviewed medications and allergies.     Objective:   Physical Exam BP (!) 188/118 (BP Location: Right Arm, Patient Position: Sitting, Cuff Size: Normal)   Pulse 90   Ht 5' 1 (1.549 m)   Wt 179 lb (81.2 kg)   BMI 33.82 kg/m     Skin warm and dry.  Lungs: clear to ausculation bilaterally. Cardiovascular: regular rate and rhythm.  Fall risk is low  Upstream - 03/27/24 1131       Pregnancy Intention Screening   Does the patient want to become pregnant in the next year? No    Does the patient's partner want to become pregnant in the next year? No    Would the patient like to discuss contraceptive options today? No      Contraception Wrap Up   Current Method Hormonal Injection    End Method Hormonal Injection    Contraception Counseling Provided Yes          Assessment:     1. Hypertension, unspecified type (Primary) She says she is taking 2 pills, but not sure of name [facility] and they have not refilled any and then called [name] in [place] and they last filled Norvasc  5 mg in July 2024 Will rx Norvasc  10 mg 1 daily and Microzide  12.5 mg 1 daily  Meds ordered this encounter  Medications   hydrochlorothiazide   (MICROZIDE ) 12.5 MG capsule    Sig: Take 1 capsule (12.5 mg total) by mouth daily.    Dispense:  30 capsule    Refill:  6    Supervising Provider:   JAYNE MINDER Edwards [[id number]]   amLODipine  (NORVASC ) 10 MG tablet    Sig: Take 1 tablet (10 mg total) by mouth daily.    Dispense:  30 tablet    Refill:  6    Supervising Provider:   JAYNE MINDER Edwards [[id number]]    Check CMP   - Comprehensive metabolic panel with GFR Get BP cuff and start checking BP at home Follow up with me tomorrow and bring meds she says she is taking Try to decrease THC use, had smoked today She says cigarettes, a pack lasts a week, try to decrease those too Discussed signs of stroke with her, if has any go to ER, she declines going now Decrease salt and sugars   2. Weight gain Has gained weight     Plan:     Follow up in 1 day for recheck on BP Get PCP Go to ER if any changes, like symptoms of stroke we discussed, she says she well

## 2024-03-28 ENCOUNTER — Ambulatory Visit: Payer: Self-pay | Admitting: Adult Health

## 2024-03-28 ENCOUNTER — Encounter: Payer: Self-pay | Admitting: Adult Health

## 2024-03-28 ENCOUNTER — Ambulatory Visit: Payer: MEDICAID | Admitting: Adult Health

## 2024-03-28 VITALS — BP 157/104 | HR 90 | Ht 61.0 in | Wt 179.0 lb

## 2024-03-28 DIAGNOSIS — I10 Essential (primary) hypertension: Secondary | ICD-10-CM | POA: Diagnosis not present

## 2024-03-28 LAB — COMPREHENSIVE METABOLIC PANEL WITH GFR
ALT: 12 IU/L (ref 0–32)
AST: 14 IU/L (ref 0–40)
Albumin: 4.3 g/dL (ref 3.9–4.9)
Alkaline Phosphatase: 56 IU/L (ref 44–121)
BUN/Creatinine Ratio: 9 (ref 9–23)
BUN: 9 mg/dL (ref 6–24)
Bilirubin Total: 0.3 mg/dL (ref 0.0–1.2)
CO2: 16 mmol/L — ABNORMAL LOW (ref 20–29)
Calcium: 9.2 mg/dL (ref 8.7–10.2)
Chloride: 109 mmol/L — ABNORMAL HIGH (ref 96–106)
Creatinine, Ser: 1.03 mg/dL — ABNORMAL HIGH (ref 0.57–1.00)
Globulin, Total: 3.2 g/dL (ref 1.5–4.5)
Glucose: 74 mg/dL (ref 70–99)
Potassium: 3.5 mmol/L (ref 3.5–5.2)
Sodium: 142 mmol/L (ref 134–144)
Total Protein: 7.5 g/dL (ref 6.0–8.5)
eGFR: 70 mL/min/1.73 (ref >59)

## 2024-03-28 NOTE — Progress Notes (Signed)
  Subjective:     Patient ID: Carol Edwards, female   DOB: July 18, 1981, 43 y.o.   MRN: [id number]  HPI Carol Edwards is a 43 year old black female, with SO, [id number] in back in for BP check and it is better than yesterday, got meds and too them, she brought in old bottles and not refilled since last year, so not taking like she should, throw them out. She did say she did not buy more cigarettes.  She is getting BP cuff to today and will start recording BP at home.     Component Value Date/Time   DIAGPAP  03/22/2023 1410    - Negative for intraepithelial lesion or malignancy (NILM)   HPVHIGH Negative 03/22/2023 1410   ADEQPAP  03/22/2023 1410    Satisfactory for evaluation; transformation zone component ABSENT.    Review of Systems No complaints today Reviewed past medical,surgical, social and family history. Reviewed medications and allergies.     Objective:   Physical Exam BP (!) 157/104 (BP Location: Left Arm, Patient Position: Sitting, Cuff Size: Normal)   Pulse 90   Ht 5' 1 (1.549 m)   Wt 179 lb (81.2 kg)   BMI 33.82 kg/m     Skin warm and dry.  Lungs: clear to ausculation bilaterally. Cardiovascular: regular rate and rhythm.   Upstream - 03/28/24 0845       Pregnancy Intention Screening   Does the patient want to become pregnant in the next year? No    Does the patient's partner want to become pregnant in the next year? No    Would the patient like to discuss contraceptive options today? No      Contraception Wrap Up   Current Method Hormonal Injection    End Method Hormonal Injection    Contraception Counseling Provided Yes          Assessment:     1. Hypertension, unspecified type (Primary) Take Norvasc  10- mg 1 daily and microzide  12.5 mg 1 daily has refills, and she got pill box to put in so she remembers to take meds, stressed importance of talking meds  Decrease salt and sugars Review DASH diet Increase water, creatinine was  1.3 on CMP yesterday, BUN was 9 and LFT  normal and K+ was 3.5 Try to not smoke any more cigarettes and decrease THC use too    Plan:     Return in 2 weeks for BP check and ROS She is trying to get PCP in [place] now

## 2024-04-11 ENCOUNTER — Ambulatory Visit: Payer: MEDICAID | Admitting: Adult Health

## 2024-04-14 ENCOUNTER — Ambulatory Visit (INDEPENDENT_AMBULATORY_CARE_PROVIDER_SITE_OTHER): Payer: MEDICAID | Admitting: Adult Health

## 2024-04-14 ENCOUNTER — Encounter: Payer: Self-pay | Admitting: Adult Health

## 2024-04-14 VITALS — BP 135/92 | HR 106 | Ht 61.0 in | Wt 179.0 lb

## 2024-04-14 DIAGNOSIS — I10 Essential (primary) hypertension: Secondary | ICD-10-CM

## 2024-04-14 NOTE — Progress Notes (Signed)
  Subjective:     Patient ID: Carol Edwards, female   DOB: 01-14-1981, 43 y.o.   MRN: [id number]  HPI Carol Edwards is a 43 year old black female,with SO, [id number] in for BP check, she is taking her meds and checking BP at home was, 138/89 today.     Component Value Date/Time   DIAGPAP  03/22/2023 1410    - Negative for intraepithelial lesion or malignancy (NILM)   HPVHIGH Negative 03/22/2023 1410   ADEQPAP  03/22/2023 1410    Satisfactory for evaluation; transformation zone component ABSENT.    Review of Systems No complaints Reviewed past medical,surgical, social and family history. Reviewed medications and allergies.     Objective:   Physical Exam BP (!) 135/92 (BP Location: Right Arm, Patient Position: Sitting, Cuff Size: Normal)   Pulse (!) 106   Ht 5' 1 (1.549 m)   Wt 179 lb (81.2 kg)   BMI 33.82 kg/m     Skin warm and dry. Lungs: clear to ausculation bilaterally. Cardiovascular: regular rate and rhythm.  Upstream - 04/14/24 1152       Pregnancy Intention Screening   Does the patient want to become pregnant in the next year? No    Does the patient's partner want to become pregnant in the next year? No    Would the patient like to discuss contraceptive options today? No      Contraception Wrap Up   Current Method Hormonal Injection    End Method Hormonal Injection    Contraception Counseling Provided Yes           Assessment:     1. Hypertension, unspecified type (Primary) BP is better Continue Norvasc  10 mg 1 daily and microzide  12.5 mg 1 daily, has refills She has decreased cigarettes and not smoking THC every day Work on decreasing salt   Keep check on BP at home too.  Plan:     Checking on getting PCP Follow up in 3 months for BP check

## 2024-06-09 ENCOUNTER — Ambulatory Visit: Payer: MEDICAID

## 2024-06-09 DIAGNOSIS — Z30013 Encounter for initial prescription of injectable contraceptive: Secondary | ICD-10-CM

## 2024-06-09 DIAGNOSIS — Z3042 Encounter for surveillance of injectable contraceptive: Secondary | ICD-10-CM

## 2024-06-09 MED ORDER — MEDROXYPROGESTERONE ACETATE 150 MG/ML IM SUSY
150.0000 mg | PREFILLED_SYRINGE | Freq: Once | INTRAMUSCULAR | Status: AC
  Administered 2024-06-09: 150 mg via INTRAMUSCULAR

## 2024-06-09 NOTE — Progress Notes (Signed)
   NURSE VISIT- INJECTION  SUBJECTIVE:  Carol Edwards is a 43 y.o. [phone] female here for a Depo Provera  for contraception/period management. She is a GYN patient.   OBJECTIVE:  There were no vitals taken for this visit.  Appears well, in no apparent distress  Injection administered in: Right upper quad. gluteus  Meds ordered this encounter  Medications   medroxyPROGESTERone  Acetate SUSY 150 mg    ASSESSMENT: GYN patient Depo Provera  for contraception/period management PLAN: Follow-up: in 11-13 weeks for next Depo   Aleck FORBES Blase  06/09/2024 10:58 AM

## 2024-07-15 ENCOUNTER — Ambulatory Visit: Payer: MEDICAID | Admitting: Adult Health

## 2024-09-01 ENCOUNTER — Ambulatory Visit: Payer: MEDICAID

## 2024-09-02 ENCOUNTER — Ambulatory Visit: Payer: MEDICAID

## 2024-09-04 ENCOUNTER — Ambulatory Visit: Payer: MEDICAID | Admitting: *Deleted

## 2024-09-04 DIAGNOSIS — Z3042 Encounter for surveillance of injectable contraceptive: Secondary | ICD-10-CM

## 2024-09-04 MED ORDER — MEDROXYPROGESTERONE ACETATE 150 MG/ML IM SUSY
150.0000 mg | PREFILLED_SYRINGE | Freq: Once | INTRAMUSCULAR | Status: AC
  Administered 2024-09-04: 150 mg via INTRAMUSCULAR

## 2024-09-04 NOTE — Progress Notes (Signed)
" ° °  NURSE VISIT- INJECTION  SUBJECTIVE:  Carol Edwards is a 44 y.o. [phone] female here for a Depo Provera  for contraception/period management. She is a GYN patient.   OBJECTIVE:  There were no vitals taken for this visit.  Appears well, in no apparent distress  Injection administered in: Left upper quad. gluteus  Meds ordered this encounter  Medications   medroxyPROGESTERone  Acetate SUSY 150 mg    ASSESSMENT: GYN patient Depo Provera  for contraception/period management PLAN: Follow-up: in 11-13 weeks for next Depo   Carol Edwards  09/04/2024 9:41 AM  "

## 2024-11-27 ENCOUNTER — Ambulatory Visit: Payer: MEDICAID
